# Patient Record
Sex: Female | Born: 1950
Health system: Southern US, Community
[De-identification: ages and names within clinical notes are randomized; demographics above are authoritative.]

## PROBLEM LIST (undated history)

## (undated) DIAGNOSIS — M1990 Unspecified osteoarthritis, unspecified site: Secondary | ICD-10-CM

## (undated) DIAGNOSIS — E039 Hypothyroidism, unspecified: Secondary | ICD-10-CM

## (undated) DIAGNOSIS — J309 Allergic rhinitis, unspecified: Secondary | ICD-10-CM

## (undated) DIAGNOSIS — F449 Dissociative and conversion disorder, unspecified: Secondary | ICD-10-CM

## (undated) DIAGNOSIS — E785 Hyperlipidemia, unspecified: Secondary | ICD-10-CM

## (undated) DIAGNOSIS — I10 Essential (primary) hypertension: Secondary | ICD-10-CM

## (undated) HISTORY — DX: Hyperlipidemia, unspecified: E78.5

## (undated) HISTORY — DX: Hypothyroidism, unspecified: E03.9

## (undated) HISTORY — DX: Dissociative and conversion disorder, unspecified: F44.9

## (undated) HISTORY — DX: Allergic rhinitis, unspecified: J30.9

## (undated) HISTORY — DX: Unspecified osteoarthritis, unspecified site: M19.90

## (undated) HISTORY — DX: Essential (primary) hypertension: I10

---

## 2000-05-10 ENCOUNTER — Other Ambulatory Visit: Admission: RE | Admit: 2000-05-10 | Discharge: 2000-05-10 | Payer: Self-pay | Admitting: *Deleted

## 2002-05-17 ENCOUNTER — Other Ambulatory Visit: Admission: RE | Admit: 2002-05-17 | Discharge: 2002-05-17 | Payer: Self-pay | Admitting: Obstetrics and Gynecology

## 2003-03-16 ENCOUNTER — Encounter: Payer: Self-pay | Admitting: *Deleted

## 2003-03-16 ENCOUNTER — Emergency Department (HOSPITAL_COMMUNITY): Admission: EM | Admit: 2003-03-16 | Discharge: 2003-03-16 | Payer: Self-pay | Admitting: *Deleted

## 2006-01-19 ENCOUNTER — Ambulatory Visit: Payer: Self-pay | Admitting: Family Medicine

## 2006-04-07 ENCOUNTER — Ambulatory Visit: Payer: Self-pay | Admitting: Family Medicine

## 2006-05-09 ENCOUNTER — Ambulatory Visit: Payer: Self-pay | Admitting: Family Medicine

## 2006-06-21 ENCOUNTER — Ambulatory Visit: Payer: Self-pay | Admitting: Family Medicine

## 2006-07-29 ENCOUNTER — Ambulatory Visit: Payer: Self-pay | Admitting: Family Medicine

## 2006-09-13 ENCOUNTER — Ambulatory Visit: Payer: Self-pay | Admitting: Family Medicine

## 2006-09-13 LAB — CONVERTED CEMR LAB: Free T4: 0.5 ng/dL — ABNORMAL LOW (ref 0.6–1.6)

## 2008-01-30 ENCOUNTER — Ambulatory Visit: Payer: Self-pay | Admitting: Family Medicine

## 2008-01-31 ENCOUNTER — Telehealth (INDEPENDENT_AMBULATORY_CARE_PROVIDER_SITE_OTHER): Payer: Self-pay | Admitting: Internal Medicine

## 2008-02-24 HISTORY — PX: WRIST SURGERY: SHX841

## 2008-03-18 ENCOUNTER — Emergency Department (HOSPITAL_COMMUNITY): Admission: EM | Admit: 2008-03-18 | Discharge: 2008-03-18 | Payer: Self-pay | Admitting: Emergency Medicine

## 2008-03-20 ENCOUNTER — Ambulatory Visit (HOSPITAL_BASED_OUTPATIENT_CLINIC_OR_DEPARTMENT_OTHER): Admission: RE | Admit: 2008-03-20 | Discharge: 2008-03-20 | Payer: Self-pay | Admitting: Orthopedic Surgery

## 2008-06-05 ENCOUNTER — Ambulatory Visit: Payer: Self-pay | Admitting: Family Medicine

## 2008-06-07 ENCOUNTER — Telehealth: Payer: Self-pay | Admitting: Family Medicine

## 2008-08-16 ENCOUNTER — Encounter: Payer: Self-pay | Admitting: Family Medicine

## 2010-08-12 ENCOUNTER — Ambulatory Visit
Admission: RE | Admit: 2010-08-12 | Discharge: 2010-08-12 | Payer: Self-pay | Source: Home / Self Care | Attending: Family Medicine | Admitting: Family Medicine

## 2010-08-12 DIAGNOSIS — J309 Allergic rhinitis, unspecified: Secondary | ICD-10-CM | POA: Insufficient documentation

## 2010-08-12 DIAGNOSIS — I10 Essential (primary) hypertension: Secondary | ICD-10-CM | POA: Insufficient documentation

## 2010-08-27 NOTE — Assessment & Plan Note (Signed)
Summary: SORE THROAT/JBB   Vital Signs:  Patient Profile:   60 Years Old Female CC:      Sore Throat Height:     62 inches Weight:      145 pounds BMI:     26.62 O2 Sat:      99 % O2 treatment:    Room Air Temp:     97.8 degrees F oral Pulse rate:   76 / minute Pulse rhythm:   regular Resp:     20 per minute BP sitting:   152 / 90  (left arm)  Pt. in pain?   yes    Location:   neck    Intensity:   5    Type:       aching  Vitals Entered By: Levonne Spiller EMT-P (August 12, 2010 11:32 AM)              Is Patient Diabetic? No  Does patient need assistance? Functional Status Self care Ambulation Normal Comments Pt. quit smoking in 2006      Current Allergies: No known allergies History of Present Illness History from: patient Reason for visit: see chief complaint Chief Complaint: Sore Throat History of Present Illness: This patient is presenting today because she's been experiencing a sore throat for the past several days and it seems to be getting worse. She reports that she has not seen a physician since 2009. She reports that she has been concerned because her daughter was diagnosed with a sinus infection recently and she reports that she's having some postnasal drainage and she's been taking some sinus medications over-the-counter but had to discontinue because she developed palpitations when she took pseudoephedrine. She reports that she's taking no medications at this time. She reports that she has had elevated blood pressures in the past but never diagnosed with hypertension. She has not been seen by a physician in nearly 2 years.  REVIEW OF SYSTEMS Constitutional Symptoms       Complains of fever and chills.     Denies night sweats, weight loss, weight gain, and fatigue.  Eyes       Denies change in vision, eye pain, eye discharge, glasses, contact lenses, and eye surgery. Ear/Nose/Throat/Mouth       Complains of sore throat.      Denies hearing loss/aids,  change in hearing, ear pain, ear discharge, dizziness, frequent runny nose, frequent nose bleeds, sinus problems, hoarseness, and tooth pain or bleeding.  Respiratory       Complains of productive cough.      Denies dry cough, wheezing, shortness of breath, asthma, bronchitis, and emphysema/COPD.      Comments: Clear Sputum Cardiovascular       Denies murmurs, chest pain, and tires easily with exhertion.    Gastrointestinal       Denies stomach pain, nausea/vomiting, diarrhea, constipation, blood in bowel movements, and indigestion. Genitourniary       Denies painful urination, blood or discharge from vagina, kidney stones, and loss of urinary control. Neurological       Denies paralysis, seizures, and fainting/blackouts. Musculoskeletal       Denies muscle pain, joint pain, joint stiffness, decreased range of motion, redness, swelling, muscle weakness, and gout.  Skin       Denies bruising, unusual mles/lumps or sores, and hair/skin or nail changes.  Psych       Denies mood changes, temper/anger issues, anxiety/stress, speech problems, depression, and sleep problems. Other Comments: Pt. states that she  feels that there is a lump in her throat.  Lab Results    Ordered by:  Dr. Maryln Manuel    Date tests performed: 08/12/2010    Performed by:  Levonne Spiller EMT-P    R. Strep:    Neg    Past History:  Past Medical History: osteoarthritis involving the left wrist from a fracture injury in 2009  Past Surgical History: Reviewed history from 06/05/2008 and no changes required. fx R wrist 8/09- surgery  ortho- [facility]  Family History: Mother  Father - Deceased from Lung Cancer age 78 Brother Sister   Social History: Quit Tobacco in April 2007 ETOH 4-5/wk (beers) Denies Recreational Drugs Physical Exam General appearance: well developed, well nourished, no acute distress Head: normocephalic, atraumatic Eyes: conjunctivae and lids normal Pupils: equal, round, reactive to  light Ears: normal, no lesions or deformities Nasal: marked sinus and nasal congestion Oral/Pharynx: tongue normal, posterior pharynx without erythema or exudate Neck: neck supple,  trachea midline, no masses Chest/Lungs: no rales, wheezes, or rhonchi bilateral, breath sounds equal without effort Heart: regular rate and  rhythm, no murmur, normal s1, s2 sounds Abdomen: soft, non-tender without obvious organomegaly Extremities: deformed arthritic left wrist  Neurological: grossly intact and non-focal Skin: no obvious rashes or lesions MSE: oriented to time, place, and person Assessment Problems:   CONTUSION, HEAD (ICD-920) LACERATION, FACE (ICD-873.40) INSECT BITE (ICD-919.4) New Problems: ELEVATED BP READING WITHOUT DX HYPERTENSION (ICD-796.2) ACUTE PHARYNGITIS (ICD-462) ALLERGIC RHINITIS CAUSE UNSPECIFIED (ICD-477.9)   Patient Education: Patient and/or caregiver instructed in the following: rest. The risks, benefits and possible side effects were clearly explained and discussed with the patient.  The patient verbalized clear understanding.  The patient was given instructions to return if symptoms don't improve, worsen or new changes develop.  If it is not during clinic hours and the patient cannot get back to this clinic then the patient was told to seek medical care at an available urgent care or emergency department.  The patient verbalized understanding.   Demonstrates willingness to comply.  Plan New Medications/Changes: LORATADINE 10 MG TABS (LORATADINE) take 1 by mouth daily  #14 x 0, 08/12/2010, Mahkayla Preece MD AMOXICILLIN 875 MG TABS (AMOXICILLIN) take 1 by mouth two times a day until completed  #20 x 0, 08/12/2010, Delontae Lamm MD FLUTICASONE PROPIONATE 50 MCG/ACT SUSP (FLUTICASONE PROPIONATE) 2 sprays per nostril once daily  #1 x 0, 08/12/2010, Kechia Yahnke MD  Planning Comments:   I explained to the patient the importance of following closely on her elevated  blood pressure. I told her to discontinue the use of all decongestants. In addition, I recommended that she come have her blood pressure retested in one week. I asked her to followup with her primary care provider and to start seeing them on a regular basis. The patient verbalized understanding. In addition, I asked the patient to return if symptoms worsen or don't improve or new symptoms develop.  Follow Up: Follow up in 2-3 days if no improvement, Follow up on an as needed basis, Follow up with Primary Physician  The patient and/or caregiver has been counseled thoroughly with regard to medications prescribed including dosage, schedule, interactions, rationale for use, and possible side effects and they verbalize understanding.  Diagnoses and expected course of recovery discussed and will return if not improved as expected or if the condition worsens. Patient and/or caregiver verbalized understanding.  Prescriptions: LORATADINE 10 MG TABS (LORATADINE) take 1 by mouth daily  #14 x 0   Entered  and Authorized by:   Standley Dakins MD   Signed by:   Standley Dakins MD on 08/12/2010   Method used:   [facility]        [facility]  [place] [id number]       [address]       [place], [place]  [address]       Ph: [phone] or [phone]       Fax: [phone]   RxID:   [id number] AMOXICILLIN 875 MG TABS (AMOXICILLIN) take 1 by mouth two times a day until completed  #20 x 0   Entered and Authorized by:   Standley Dakins MD   Signed by:   Standley Dakins MD on 08/12/2010   Method used:   [facility]        [facility]  [place] [id number]       [address]       [place], [place]  [address]       Ph: [phone] or [phone]       Fax: [phone]   RxID:   [id number] FLUTICASONE PROPIONATE 50 MCG/ACT SUSP (FLUTICASONE PROPIONATE) 2 sprays per nostril once daily  #1 x 0   Entered and Authorized by:   Standley Dakins MD   Signed by:   Standley Dakins MD on 08/12/2010   Method used:   [facility]        [facility]  [place] (retail)       [address]       [place], [place]  [address]       Ph: [phone] or [phone]       Fax: [phone]   RxID:   [id number]   Patient Instructions: 1)  Check your Blood Pressure regularly. If it is above: 140/90 you should make an appointment. 2)  Take your antibiotic as prescribed until ALL of it is gone, but stop if you develop a rash or swelling and contact our office as soon as possible. 3)  Acute sinusitis symptoms for less than 10 days are not helped by antibiotics.Use warm moist compresses, and over the counter decongestants ( only as directed). Call if no improvement in 5-7 days, sooner if increasing pain, fever, or new symptoms. 4)  Return or go to the ER if no improvement or symptoms getting worse.   5)  Oral Rehydration Solution: drink 1/2 ounce every 15 minutes. If tolerated afert 1 hour, drink 1 ounce every 15 minutes. As you can tolerate, keep adding 1/2 ounce every 15 minutes, up to a total of 2-4 ounces. Contact the office if unable to tolerate oral solution, if you keep vomiting, or you continue to have signs of dehydration. 6)  The patient was informed that there is no on-call provider or services available at this clinic during off-hours (when the clinic is closed).  If the patient developed a problem or concern that required immediate attention, the patient was advised to go the the nearest available urgent care or emergency department for medical care.  The patient verbalized understanding.    7)  It was clearly explained to the patient that this [facility] is not intended to be a primary care clinic.  The patient is always better served by the continuity of care and the provider/patient relationships developed with their dedicated primary care provider.  The patient was told to be sure to follow up as soon as possible with their primary care provider to  discuss treatments received and to receive  further examination and testing.  The patient verbalized understanding. The will f/u with PCP ASAP.

## 2010-09-23 ENCOUNTER — Encounter: Payer: Self-pay | Admitting: Family Medicine

## 2010-09-23 ENCOUNTER — Other Ambulatory Visit: Payer: Self-pay | Admitting: Family Medicine

## 2010-09-23 ENCOUNTER — Ambulatory Visit (INDEPENDENT_AMBULATORY_CARE_PROVIDER_SITE_OTHER): Payer: BC Managed Care – PPO | Admitting: Family Medicine

## 2010-09-23 DIAGNOSIS — F449 Dissociative and conversion disorder, unspecified: Secondary | ICD-10-CM | POA: Insufficient documentation

## 2010-09-23 DIAGNOSIS — E079 Disorder of thyroid, unspecified: Secondary | ICD-10-CM

## 2010-09-23 DIAGNOSIS — I10 Essential (primary) hypertension: Secondary | ICD-10-CM

## 2010-09-23 LAB — BASIC METABOLIC PANEL
Chloride: 105 mEq/L (ref 96–112)
GFR: 78.87 mL/min (ref >60.00)
Glucose, Bld: 91 mg/dL (ref 70–99)
Potassium: 4.7 mEq/L (ref 3.5–5.1)
Sodium: 140 mEq/L (ref 135–145)

## 2010-09-23 LAB — HEPATIC FUNCTION PANEL
Total Bilirubin: 0.4 mg/dL (ref 0.3–1.2)
Total Protein: 7.6 g/dL (ref 6.0–8.3)

## 2010-09-23 LAB — CBC WITH DIFFERENTIAL/PLATELET
Basophils Relative: 0.6 % (ref 0.0–3.0)
Eosinophils Relative: 2.3 % (ref 0.0–5.0)
Hemoglobin: 13.3 g/dL (ref 12.0–15.0)
Lymphocytes Relative: 19.6 % (ref 12.0–46.0)
Lymphs Abs: 1.8 10*3/uL (ref 0.7–4.0)
Monocytes Absolute: 0.5 10*3/uL (ref 0.1–1.0)
RBC: 3.96 Mil/uL (ref 3.87–5.11)
RDW: 13.3 % (ref 11.5–14.6)

## 2010-09-23 LAB — TSH: TSH: 7.93 u[IU]/mL — ABNORMAL HIGH (ref 0.35–5.50)

## 2010-09-24 ENCOUNTER — Telehealth: Payer: Self-pay | Admitting: Family Medicine

## 2010-09-24 LAB — T3, FREE: T3, Free: 2.4 pg/mL (ref 2.3–4.2)

## 2010-09-25 ENCOUNTER — Telehealth (INDEPENDENT_AMBULATORY_CARE_PROVIDER_SITE_OTHER): Payer: Self-pay | Admitting: *Deleted

## 2010-09-25 ENCOUNTER — Telehealth: Payer: Self-pay | Admitting: Family Medicine

## 2010-09-30 ENCOUNTER — Ambulatory Visit: Payer: BC Managed Care – PPO | Admitting: Family Medicine

## 2010-10-01 NOTE — Assessment & Plan Note (Signed)
Summary: ST/CLE  BCBS   Vital Signs:  Patient profile:   60 year old female Weight:      150.50 pounds Temp:     98.5 degrees F oral Pulse rate:   72 / minute Pulse rhythm:   regular BP sitting:   160 / 100  (left arm) Cuff size:   regular  Vitals Entered By: Selena Batten Dance CMA Duncan Dull) (September 23, 2010 8:28 AM)  Serial Vital Signs/Assessments:  Time      Position  BP       Pulse  Resp  Temp     By                     190/100                        Eustaquio Boyden  MD  CC: Sore throat (intermittant since December)   History of Present Illness: CC: ST since december  2 mo h/o ST (since [place]).  Sunday really bothered her so called for appt, hasn't hurt since then.  Not necessarily pain.  No problems with swallowing.  feels sensation of lump in throat.  + coughing and sneezing.  mild PNDrip.  doesn't notice associated with certain foods.  Does notice as day progresses more sensation of lump in throat.    No fevers/chills, chest congestion, sinus congestion, HA, abd pain, n/v/d, weight changes, changing NS (h/o same since menopause).  No reflux sxs.  no hoarseness.  No sick contacts around her.  No smokers at home (pt quit 10/2005).  No h/o asthma, COPD.  No h/o seasonal allergies.  drinks 3-4 cups coffee/day as well as coke products all day long.  no h/o reflux  BP up, states white coat hypertension.  not on meds for this.  no HA, vision changes, chest pain, tightness, urinary changes, LE swelling.  Not much salt intake.  on recheck elevated again.  + family history of HTN.  drinks 4 beers/night.  Current Medications (verified): 1)  Fosamax 70 Mg Tabs (Alendronate Sodium) .... One By Mouth Q Week  Allergies (verified): No Known Drug Allergies  Past History:  Past Medical History: Last updated: 08/12/2010 osteoarthritis involving the left wrist from a fracture injury in 2009  Social History: Last updated: 08/12/2010 Quit Tobacco in April 2007 ETOH 4-5/wk (beers) Denies  Recreational Drugs  Review of Systems       per HPI  Physical Exam  General:  well developed, well nourished, no acute distress Head:  Normocephalic and atraumatic without obvious abnormalities. No apparent alopecia or balding. Eyes:  No corneal or conjunctival inflammation noted. EOMI. Perrla.  Ears:  TMs clear bilaterally, scant cerumen Nose:  nares clear bilaterally Mouth:  MMM< no pharyngeal erythema/exudates Neck:  No deformities, masses, or tenderness noted.  no LAD Lungs:  Normal respiratory effort, chest expands symmetrically. Lungs are clear to auscultation, no crackles or wheezes. Heart:  Normal rate and regular rhythm. S1 and S2 normal without gallop, murmur, click, rub or other extra sounds. Pulses:  2+ rad pulses bilaterally, brisk cap refill Extremities:  no pedal edema   Impression & Recommendations:  Problem # 1:  HYPERTENSION, ESSENTIAL (ICD-401.9) BP very elevated today - has been in past.  pt hesitant to start med.  on recheck, even more elevated to 190/100.  dsicussed this, recommended start med, blood work checked.  Start HCTZ 25mg  daily.  Return 1 mo for f/u.  hypertensive urgency today.  Orders: Venipuncture (40981) TLB-BMP (Basic Metabolic Panel-BMET) (80048-METABOL) TLB-CBC Platelet - w/Differential (85025-CBCD) TLB-Hepatic/Liver Function Pnl (80076-HEPATIC) TLB-TSH (Thyroid Stimulating Hormone) (84443-TSH)  Her updated medication list for this problem includes:    Hydrochlorothiazide 25 Mg Tabs (Hydrochlorothiazide) .Marland Kitchen... Take one daily for blood pressure  BP today: 160/100 Prior BP: 152/90 (08/12/2010)  Problem # 2:  GLOBUS HYSTERICUS (ICD-300.11) ? silent reflux.  trial of PPI x 2-3 wks, return if not better. quit smoking 2007 but h/o smoking in past.  if not better, consider referral to ENT.  Complete Medication List: 1)  Fosamax 70 Mg Tabs (Alendronate sodium) .... One by mouth q week 2)  Omeprazole 40 Mg Cpdr (Omeprazole) .... One daily for  3 weeks 3)  Hydrochlorothiazide 25 Mg Tabs (Hydrochlorothiazide) .... Take one daily for blood pressure  Patient Instructions: 1)  Sounds like some silent reflux  2)  Treat with omeprazole daily for 2-3 wks (30 min before meal) 3)  cut back on caffeine as you can 4)  watch spicy foods or acidic foods like citrus and tomatoesdrink plenty of water. 5)  Return if not improving, or if any fevers/chills, weight changes, or hoarseness. 6)  Start hydrochlorothiazide once daily for blood pressure.  return next wednesday for follow up with Dr Milinda Antis. Prescriptions: HYDROCHLOROTHIAZIDE 25 MG TABS (HYDROCHLOROTHIAZIDE) take one daily for blood pressure  #30 x 3   Entered and Authorized by:   Eustaquio Boyden  MD   Signed by:   Eustaquio Boyden  MD on 09/23/2010   Method used:   [facility]        [facility]  [place] [facility]       [address]       [place], [place]  [address]       Ph: [phone] or [phone]       Fax: [phone]   RxID:   [id number] OMEPRAZOLE 40 MG CPDR (OMEPRAZOLE) one daily for 3 weeks  #30 x 0   Entered and Authorized by:   Eustaquio Boyden  MD   Signed by:   Eustaquio Boyden  MD on 09/23/2010   Method used:   [facility]        [facility]  [place] [id number]       [address]       [place], [place]  [address]       Ph: [phone] or [phone]       Fax: [phone]   RxID:   [id number]    Orders Added: 1)  Venipuncture [83151] 2)  TLB-BMP (Basic Metabolic Panel-BMET) [80048-METABOL] 3)  TLB-CBC Platelet - w/Differential [85025-CBCD] 4)  TLB-Hepatic/Liver Function Pnl [80076-HEPATIC] 5)  TLB-TSH (Thyroid Stimulating Hormone) [84443-TSH] 6)  Est. Patient Level IV [76160]    Current Allergies (reviewed today): No known allergies

## 2010-10-01 NOTE — Progress Notes (Signed)
----   Converted from flag ---- ---- 09/23/2010 6:24 PM, Eustaquio Boyden  MD wrote: can we add free T3 and free T4 to blood in lab?  thanks. 246.9 ------------------------------

## 2010-10-06 NOTE — Progress Notes (Signed)
Summary: regarding hctz  Phone Note Call from Patient   Caller: Patient Complaint: Earache/Ear Infection Summary of Call: Pt was seen yesterday and given hctz.  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  She cancelled her appt for next wednesday, will call back to reschedule after she starts the medicine.  Initial call taken by: Lowella Petties CMA, AAMA,  September 24, 2010 12:49 PM  Follow-up for Phone Call        noted.  want her to f/u in next few weeks to monitor BP.  have her keep track of bp at home [facility], if staying high, return sooner.  if any chest pain/HA, needs to go to ER. Follow-up by: Eustaquio Boyden  MD,  September 24, 2010 12:53 PM  Additional Follow-up for Phone Call Additional follow up Details #1::        Message left for patient to return my call. Kim Dance CMA Duncan Dull)  September 24, 2010 2:56 PM   Message left for patient to return my call. Kim Dance CMA Duncan Dull)  September 25, 2010 11:59 AM   Message left for patient to return my call. Kim Dance CMA Duncan Dull)  September 28, 2010 12:29 PM     Additional Follow-up for Phone Call Additional follow up Details #2::    Spoke with pt and gave instructions.             Lowella Petties CMA, AAMA  September 28, 2010 2:32 PM

## 2010-10-06 NOTE — Progress Notes (Signed)
Summary: prior Berkley Harvey is needed for omeprazole  Phone Note From Pharmacy   [facility]  [place] [facility] Summary of Call: Prior Berkley Harvey is needed for omeprazole, prior auth form is in your box, Dr. Royden Purl pt, but you prescribed medicine.  Thanks. Initial call taken by: Lowella Petties CMA, AAMA,  September 25, 2010 12:45 PM  Follow-up for Phone Call        changed med per insurance.  please notify patient may be OTC. Follow-up by: Eustaquio Boyden  MD,  September 25, 2010 1:48 PM  Additional Follow-up for Phone Call Additional follow up Details #1::        Message left for patient to return my call. Kim Dance CMA Duncan Dull)  September 28, 2010 12:29 PM   Advised pt. Additional Follow-up by: Lowella Petties CMA, AAMA,  September 28, 2010 2:33 PM    New/Updated Medications: OMEPRAZOLE MAGNESIUM 20.6 (20 BASE) MG CPDR (OMEPRAZOLE MAGNESIUM) take one daily 30 min before meal OMEPRAZOLE 20 MG CPDR (OMEPRAZOLE) one daily 30 min before meals for 3 wks Prescriptions: OMEPRAZOLE 20 MG CPDR (OMEPRAZOLE) one daily 30 min before meals for 3 wks  #30 x 0   Entered and Authorized by:   Eustaquio Boyden  MD   Signed by:   Eustaquio Boyden  MD on 09/25/2010   Method used:   [facility]        [facility]  [place] [facility]       [address]       [place], [place]  [address]       Ph: [phone] or [phone]       Fax: [phone]   RxID:   [id number] OMEPRAZOLE MAGNESIUM 20.6 (20 BASE) MG CPDR (OMEPRAZOLE MAGNESIUM) take one daily 30 min before meal  #30 x 0   Entered and Authorized by:   Eustaquio Boyden  MD   Signed by:   Eustaquio Boyden  MD on 09/25/2010   Method used:   [facility]        [facility]  [place] (retail)       [address]       [place], [place]  [address]       Ph: [phone] or [phone]       Fax: [phone]   RxID:   [id number]

## 2010-10-07 ENCOUNTER — Encounter: Payer: Self-pay | Admitting: Family Medicine

## 2010-10-07 ENCOUNTER — Ambulatory Visit (INDEPENDENT_AMBULATORY_CARE_PROVIDER_SITE_OTHER): Payer: BC Managed Care – PPO | Admitting: Family Medicine

## 2010-10-07 DIAGNOSIS — F449 Dissociative and conversion disorder, unspecified: Secondary | ICD-10-CM

## 2010-10-07 DIAGNOSIS — I10 Essential (primary) hypertension: Secondary | ICD-10-CM

## 2010-10-07 DIAGNOSIS — E039 Hypothyroidism, unspecified: Secondary | ICD-10-CM | POA: Insufficient documentation

## 2010-10-13 NOTE — Assessment & Plan Note (Signed)
Summary: FOLLOW UP   Vital Signs:  Patient profile:   60 year old female Weight:      147.50 pounds Temp:     98.1 degrees F oral Pulse rate:   88 / minute Pulse rhythm:   regular BP sitting:   156 / 82  (left arm) Cuff size:   regular  Vitals Entered By: Selena Batten Dance CMA Duncan Dull) (October 07, 2010 9:23 AM)  Serial Vital Signs/Assessments:  Time      Position  BP       Pulse  Resp  Temp     By           R Arm     170/90                         Eustaquio Boyden  MD           L Arm     170/90                         Eustaquio Boyden  MD  CC: BP follow up   History of Present Illness: CC: BP f/u  seen 2-3 wks ago with elevated BP.  Started on HCTZ.  drinks water with this.  Taking HCTZ between meal.  no HA, vision changes, chest pain, tightness, SOB, urinary changes, LE swelling.  TSH elevated last visit, had been on meds in past, told had hypothyroid but had seemed to resolve, not in chart.  "lump in throat" - Has not taken reflux medicine.  takes rolaids.  not bothering her as much.  has only felt lump sensation once in last 2-3 wks for a few hours (after swallowed HCTZ pill once)  walking 1 mile a day.  down 3 lbs!  congratulated.  No coke since march 1st, no beer since march 2nd.  drinking more water.  taking fosamax weekly.  father with h/o esophageal cancer and lung cancer.  Current Medications (verified): 1)  Fosamax 70 Mg Tabs (Alendronate Sodium) .... One By Mouth Q Week 2)  Omeprazole 20 Mg Cpdr (Omeprazole) .... One Daily 30 Min Before Meals For 3 Wks 3)  Hydrochlorothiazide 25 Mg Tabs (Hydrochlorothiazide) .... Take One Daily For Blood Pressure  Allergies (verified): No Known Drug Allergies  Past History:  Social History: Last updated: 08/12/2010 Quit Tobacco in April 2007 ETOH 4-5/wk (beers) Denies Recreational Drugs  Past Medical History: osteoarthritis involving the left wrist from a fracture injury in 2009 HTN Hypothyroid  Family History: Mother  HTN Father - Deceased from Lung Cancer age 67, also with esophagus cancer. Brother HTN Sister HTN, hypothyroid  Review of Systems       per HPI  Physical Exam  General:  well developed, well nourished, no acute distress Head:  Normocephalic and atraumatic without obvious abnormalities. No apparent alopecia or balding. Mouth:  MMM< no pharyngeal erythema/exudates Neck:  No deformities, masses, or tenderness noted.  no LAD, no TM Lungs:  Normal respiratory effort, chest expands symmetrically. Lungs are clear to auscultation, no crackles or wheezes. Heart:  Normal rate and regular rhythm. S1 and S2 normal without gallop, murmur, click, rub or other extra sounds. Pulses:  2+ rad pulses bilaterally, brisk cap refill Extremities:  no pedal edema   Impression & Recommendations:  Problem # 1:  HYPERTENSION, ESSENTIAL (ICD-401.9) Assessment Improved improved some since last visit, still not at goal.rechcek 170/90.  TSH high, hypothryoid, treat and readdress BP.  (  could be accounting for HTN 2/2 elevated PVR).  Her updated medication list for this problem includes:    Hydrochlorothiazide 25 Mg Tabs (Hydrochlorothiazide) .Marland Kitchen... Take one daily for blood pressure  BP today: 156/82 Prior BP: 160/100 (09/23/2010)  Labs Reviewed: K+: 4.7 (09/23/2010) Creat: : 0.8 (09/23/2010)     Problem # 2:  HYPOTHYROIDISM (ICD-244.9) Assessment: New start synthroid.  ? accounting for HTN.  recheck in 6 wks, along with FLP.  Her updated medication list for this problem includes:    Synthroid 75 Mcg Tabs (Levothyroxine sodium) ..... One daily for thyroid  Labs Reviewed: TSH: 7.93 (09/23/2010)     Problem # 3:  GLOBUS HYSTERICUS (ICD-300.11) Assessment: Improved improving, hasnt used PPI.  only rolaid.  only has had episode x 1 in last 2-3 wks after swallowed HCTZ amd resolved after 1-2 hours.  advised monitor, if continues to bother would likely refer to GI for EGD (fmhx esoph cancer and pt on  fosamax).    no unexplained weight loss, no fevers/chills, no dysphagia.  Complete Medication List: 1)  Fosamax 70 Mg Tabs (Alendronate sodium) .... One by mouth q week 2)  Omeprazole 20 Mg Cpdr (Omeprazole) .... One daily 30 min before meals for 3 wks 3)  Hydrochlorothiazide 25 Mg Tabs (Hydrochlorothiazide) .... Take one daily for blood pressure 4)  Synthroid 75 Mcg Tabs (Levothyroxine sodium) .... One daily for thyroid  Patient Instructions: 1)  return in 2 wks for follow up with myself or Dr. Milinda Antis. 2)  we will want to check thyroid function again in 6 weeks. 3)  We may need to add another medicine at follow up. 4)  Start thyroid medicine daily. 5)  Good job with the lifestyle changes up to now! 6)  Call us with questions. Prescriptions: SYNTHROID 75 MCG TABS (LEVOTHYROXINE SODIUM) one daily for thyroid  #30 x 1   Entered and Authorized by:   Eustaquio Boyden  MD   Signed by:   Eustaquio Boyden  MD on 10/07/2010   Method used:   Electronically to        [facility]  [address] (retail)       [address]       [place], [place]  [address]       Ph: [phone] or [phone]       Fax: [phone]   RxID:   [id number]    Orders Added: 1)  Est. Patient Level IV [27253]    Current Allergies (reviewed today): No known allergies

## 2010-10-16 ENCOUNTER — Encounter: Payer: Self-pay | Admitting: Family Medicine

## 2010-10-21 ENCOUNTER — Ambulatory Visit (INDEPENDENT_AMBULATORY_CARE_PROVIDER_SITE_OTHER): Payer: BC Managed Care – PPO | Admitting: Family Medicine

## 2010-10-21 ENCOUNTER — Encounter: Payer: Self-pay | Admitting: Family Medicine

## 2010-10-21 VITALS — BP 156/90 | HR 72 | Temp 98.4°F | Wt 146.1 lb

## 2010-10-21 DIAGNOSIS — I10 Essential (primary) hypertension: Secondary | ICD-10-CM

## 2010-10-21 DIAGNOSIS — E039 Hypothyroidism, unspecified: Secondary | ICD-10-CM

## 2010-10-21 NOTE — Patient Instructions (Signed)
Return in 4 weeks for blood check (thyroid and cholesterol levels). Return after for appointment. Continue meds as up to now. Blood pressure is getting better!  Continue watching diet, exercise. Let us know if throat sensation becoming a bother to send you to stomach doctor to take a look.

## 2010-10-21 NOTE — Assessment & Plan Note (Signed)
Improved after started levothyroxine.  ? HTN 2/2 hypothyroidism from elevated PVR. No changes currently. ROS neg. Return 1 mo for f/u.

## 2010-10-21 NOTE — Assessment & Plan Note (Signed)
States feeling better on levothyroxine . Return 1 mo for recheck TSH and f/u HTN.

## 2010-10-21 NOTE — Progress Notes (Signed)
  Subjective:    Patient ID: Jocelyn Payne, female    DOB: June 29, 1951, 60 y.o.   MRN: [id number]  HPI CC: f/u HTN   Seen 2 wks ago with continued elevated bp, TSH checked and returned high.  Started on levothyroxine daily.  Actually feeling better, more energy.  BP slowly coming down.    No recent skin changes.  No diarrhea or constipation, heat/cold intolerance.  Does notice hair falling out maybe a bit more.  Due for recheck TSH in 4 wks.  No HA, vision changes, CP/tightness, SOB, leg swelling.   Globus sensation comes and goes.  Usually worse with taking pills.  Father with esophagus cancer (smoker).  Pt quit smoking 2007.  Medications and allergies reviewed and updated as above. PMhx reviewed.  Review of Systems Per HPI    Objective:   Physical Exam  Vitals reviewed. Constitutional: She appears well-developed and well-nourished. No distress.  HENT:  Head: Normocephalic and atraumatic.  Mouth/Throat: Oropharynx is clear and moist. No oropharyngeal exudate.  Eyes: Conjunctivae and EOM are normal. Pupils are equal, round, and reactive to light.  Neck: Normal range of motion. Neck supple. No thyromegaly present.  Cardiovascular: Normal rate, regular rhythm, normal heart sounds and intact distal pulses.   No murmur heard. Pulmonary/Chest: Effort normal and breath sounds normal. No respiratory distress. She has no wheezes. She has no rales.  Lymphadenopathy:    She has no cervical adenopathy.  Skin: Skin is warm and dry. No rash noted.       Assessment & Plan:

## 2010-10-22 ENCOUNTER — Ambulatory Visit: Payer: BC Managed Care – PPO | Admitting: Family Medicine

## 2010-11-11 ENCOUNTER — Other Ambulatory Visit (INDEPENDENT_AMBULATORY_CARE_PROVIDER_SITE_OTHER): Payer: BC Managed Care – PPO | Admitting: Family Medicine

## 2010-11-11 DIAGNOSIS — I10 Essential (primary) hypertension: Secondary | ICD-10-CM

## 2010-11-11 DIAGNOSIS — E785 Hyperlipidemia, unspecified: Secondary | ICD-10-CM

## 2010-11-11 DIAGNOSIS — E039 Hypothyroidism, unspecified: Secondary | ICD-10-CM

## 2010-11-11 LAB — LDL CHOLESTEROL, DIRECT: Direct LDL: 151.4 mg/dL

## 2010-11-11 LAB — TSH: TSH: 2.57 u[IU]/mL (ref 0.35–5.50)

## 2010-11-11 LAB — LIPID PANEL
Cholesterol: 234 mg/dL — ABNORMAL HIGH (ref 0–200)
Total CHOL/HDL Ratio: 4
Triglycerides: 92 mg/dL (ref 0.0–149.0)
VLDL: 18.4 mg/dL (ref 0.0–40.0)

## 2010-11-12 NOTE — Progress Notes (Signed)
Patient notified. Will keep follow up.

## 2010-11-17 ENCOUNTER — Other Ambulatory Visit: Payer: Self-pay | Admitting: *Deleted

## 2010-11-17 MED ORDER — HYDROCHLOROTHIAZIDE 25 MG PO TABS: 25.0000 mg | ORAL_TABLET | Freq: Every day | ORAL | Status: DC

## 2010-11-17 NOTE — Telephone Encounter (Signed)
Refilled HCTZ for 90 supply

## 2010-11-18 ENCOUNTER — Other Ambulatory Visit: Payer: Self-pay | Admitting: *Deleted

## 2010-11-25 ENCOUNTER — Ambulatory Visit: Payer: BC Managed Care – PPO | Admitting: Family Medicine

## 2010-11-26 ENCOUNTER — Other Ambulatory Visit: Payer: Self-pay | Admitting: Family Medicine

## 2010-11-27 MED ORDER — LEVOTHYROXINE SODIUM 75 MCG PO TABS: 75.0000 ug | ORAL_TABLET | Freq: Every day | ORAL | Status: DC

## 2010-11-27 MED ORDER — HYDROCHLOROTHIAZIDE 25 MG PO TABS: 25.0000 mg | ORAL_TABLET | Freq: Every day | ORAL | Status: DC

## 2010-11-27 NOTE — Telephone Encounter (Signed)
Done

## 2010-11-27 NOTE — Telephone Encounter (Signed)
Quantity changed on levothyroxine and HCTZ to 90 this time only, per pharmacy's request.

## 2010-12-02 ENCOUNTER — Ambulatory Visit: Payer: BC Managed Care – PPO | Admitting: Family Medicine

## 2010-12-02 ENCOUNTER — Other Ambulatory Visit: Payer: Self-pay | Admitting: *Deleted

## 2010-12-02 MED ORDER — LEVOTHYROXINE SODIUM 75 MCG PO TABS: 75.0000 ug | ORAL_TABLET | Freq: Every day | ORAL | Status: DC

## 2010-12-03 ENCOUNTER — Ambulatory Visit: Payer: BC Managed Care – PPO | Admitting: Family Medicine

## 2010-12-08 NOTE — Op Note (Signed)
Jocelyn Payne, Jocelyn Payne                ACCOUNT NO.:  [id number]   MEDICAL RECORD NO.:  [id number]          PATIENT TYPE:  AMB   LOCATION:  [facility]                          FACILITY:  [facility]   PHYSICIAN:  Feliberto Gottron. Turner Daniels, M.D.   DATE OF BIRTH:  [birth year]   DATE OF PROCEDURE:  03/20/2008  DATE OF DISCHARGE:                               OPERATIVE REPORT   PREOPERATIVE DIAGNOSIS:  Left distal radius fracture, intra-articular  and comminuted.   POSTOPERATIVE DIAGNOSIS:  Left distal radius fracture, intra-articular  and comminuted.   PROCEDURE:  Open reduction and internal fixation, left wrist distal  radius fracture, using a hand innovation standard left DVR plate with 4  proximal bicortical screws and 5 distal locking screws.   SURGEON:  Feliberto Gottron. Turner Daniels, MD   FIRST ASSISTANT:  Shirl Harris, PA-C   ANESTHETIC:  General LMA and interclavicular block.   ESTIMATED BLOOD LOSS:  100 mL.   FLUID REPLACEMENT:  About 1300 mL of crystalloid.   DRAINS PLACED:  None.   TOURNIQUET TIME:  Approximately 39 minutes.   DRAINS PLACED:  None.   INDICATIONS FOR PROCEDURE:  The patient is a 60 year old woman who  slipped and fell a few days ago and sustained a closed comminuted intra-  articular left distal radius fracture with about a centimeter of  shortening and dorsal angulation of about 25 degrees.  She is quite  active.  She is 60 years old and desires elective open reduction and  internal fixation with a DVR plate to decrease pain, increase function,  and get her back out to length.  Risks and benefits of surgery discussed  and questions answered.   DESCRIPTION OF PROCEDURE:  The patient was taken to the block area at  [facility].  Appropriate anesthetic monitors were attached  and interclavicular block was induced to the left upper extremity.  She  was then taken to operating room 6, received a gram of Ancef  preoperatively.  The appropriate anesthetic monitors were  reattached and  general LMA anesthesia induced with the patient in supine position.  Tourniquet applied high at the left upper extremity, which was then  prepped and draped in usual sterile fashion.  The left upper extremity  was then prepped and draped in usual sterile fashion from the fingertips  to area of tourniquet.  A standard time-out procedure was performed and  the hair was braided.  Limb was wrapped with an Esmarch bandage,  tourniquet inflated to 300 mmHg and we began the actual procedure itself  by making a volar midline incision starting at the insertion of the FCR  tendon going proximally for about 10 cm.  We cut down through the skin  and subcutaneous tissue.  Small bleeders identified and cauterized.  The  sheath over the FCR was incised volarly.  The FCR reflected radially and  then went through the dorsal aspect of the FCR sheath dropping down on  the pronator quadratus, which was then released off its insertion on the  radial aspect of the radius.  This exposed the fracture site, which was  relatively easily reduced with traction and reverse angulation.  We then  provisionally applied a four-hole standard left DVR plate to the distal  radius using the gliding hole with a 3/5 bicortical screw and placed a  temporary distal K-wire for fixation.  C-arm images were taken showing  good reduction.  At this point with the wrist held reduced, we went  ahead and applied 3 of the distal locking screws anywhere from 20-22 mm  in length, rechecked with the C-arm and found the reduction to be  excellent.  Two more proximal locking screws were then applied and final  C-arm images taken after placing the rest of the bicortical proximal  screws.  The wrist was taken to range of motion confirming no intra-  articular placement of the screws, and again C-arm images were taken  also confirming no intra-articular placement of the screws.  At this  point, the tourniquet was let down and the  wound was irrigated out with  normal saline solution.  Small bleeders identified and cauterized.  The  subcutaneous tissue was closed with running 3-0 Vicryl suture and the  skin with running interlocking 4-0 nylon suture.  A dressing of  Xeroform, 4 x 4 dressing, sponges, Webril, Ace wrap, and a Velfoam wrist  forearm splint was then applied.  The patient was awakened and taken to  the recovery room without difficulty.       Feliberto Gottron. Turner Daniels, M.D.  Electronically Signed     FJR/MEDQ  D:  03/20/2008  T:  03/21/2008  Job:  [id number]

## 2010-12-16 ENCOUNTER — Ambulatory Visit (INDEPENDENT_AMBULATORY_CARE_PROVIDER_SITE_OTHER): Payer: BC Managed Care – PPO | Admitting: Family Medicine

## 2010-12-16 ENCOUNTER — Encounter: Payer: Self-pay | Admitting: Family Medicine

## 2010-12-16 VITALS — BP 136/84 | HR 64 | Temp 98.4°F | Wt 147.0 lb

## 2010-12-16 DIAGNOSIS — I10 Essential (primary) hypertension: Secondary | ICD-10-CM

## 2010-12-16 DIAGNOSIS — Z1231 Encounter for screening mammogram for malignant neoplasm of breast: Secondary | ICD-10-CM

## 2010-12-16 DIAGNOSIS — E785 Hyperlipidemia, unspecified: Secondary | ICD-10-CM

## 2010-12-16 DIAGNOSIS — E039 Hypothyroidism, unspecified: Secondary | ICD-10-CM

## 2010-12-16 NOTE — Assessment & Plan Note (Signed)
Stable on current dose. Continue. Return 6 mo for f/u.

## 2010-12-16 NOTE — Assessment & Plan Note (Signed)
Stable control on HCTZ 25mg  daily.  Continue.  Return in 6 mo for blood work, recheck.

## 2010-12-16 NOTE — Patient Instructions (Signed)
Good to see you today. I think things are looking good. Work on low cholesterol diet, continue meds as up to now. Call us with questions. Return in 6 months for recheck with myself or Dr. Milinda Antis.

## 2010-12-16 NOTE — Progress Notes (Signed)
  Subjective:    Patient ID: Jocelyn Payne, female    DOB: 1951-05-18, 60 y.o.   MRN: [id number]  HPI CC: f/u HTN, hypothyroid  Found to be hypertensive and hypothyroid.  On synthroid and HCTZ.  No HA, vision changes, CP/tightness, SOB, leg swelling.  No skin changes, hair changes, diarrhea, constipation.  Bit by 3 different deer ticks, leaves big spots.  No RMSF or lyme sxs, knows what to watch out for.  No family history of heart disease/attacks.    Quit smoking 2007.  Struggles daily, one motivation is cost of pack of cigarettes.  Requests to be set up for screening mammo, due for CPE.  Review of Systems Per HPI    Objective:   Physical Exam  Nursing note and vitals reviewed. Constitutional: She appears well-developed and well-nourished. No distress.  HENT:  Head: Normocephalic and atraumatic.  Mouth/Throat: Oropharynx is clear and moist. No oropharyngeal exudate.  Eyes: Conjunctivae and EOM are normal. Pupils are equal, round, and reactive to light. No scleral icterus.  Neck: Normal range of motion. Neck supple. Carotid bruit is not present. No thyromegaly present.  Cardiovascular: Normal rate, regular rhythm, normal heart sounds and intact distal pulses.   No murmur heard. Pulmonary/Chest: Effort normal and breath sounds normal. No respiratory distress. She has no wheezes. She has no rales.  Abdominal: There is no tenderness.       No abd/renal bruit  Lymphadenopathy:    She has no cervical adenopathy.  Skin: Skin is warm and dry. No rash noted.          Assessment & Plan:

## 2010-12-16 NOTE — Assessment & Plan Note (Signed)
Discussed LDL 156. Goal for her <130.   Discussed TLC, provided with handout on healthy eating.  return in 6 mo for f/u.

## 2011-02-24 ENCOUNTER — Ambulatory Visit (INDEPENDENT_AMBULATORY_CARE_PROVIDER_SITE_OTHER): Payer: BC Managed Care – PPO | Admitting: Family Medicine

## 2011-02-24 ENCOUNTER — Encounter: Payer: Self-pay | Admitting: Family Medicine

## 2011-02-24 ENCOUNTER — Ambulatory Visit: Payer: Self-pay | Admitting: Family Medicine

## 2011-02-24 VITALS — BP 138/76 | HR 80 | Temp 98.3°F | Wt 143.8 lb

## 2011-02-24 DIAGNOSIS — H6120 Impacted cerumen, unspecified ear: Secondary | ICD-10-CM

## 2011-02-24 DIAGNOSIS — H6121 Impacted cerumen, right ear: Secondary | ICD-10-CM

## 2011-02-24 DIAGNOSIS — T148 Other injury of unspecified body region: Secondary | ICD-10-CM

## 2011-02-24 DIAGNOSIS — W57XXXA Bitten or stung by nonvenomous insect and other nonvenomous arthropods, initial encounter: Secondary | ICD-10-CM

## 2011-02-24 DIAGNOSIS — T148XXA Other injury of unspecified body region, initial encounter: Secondary | ICD-10-CM

## 2011-02-24 NOTE — Progress Notes (Signed)
  Subjective:    Patient ID: Jocelyn Payne, female    DOB: 1950/09/23, 60 y.o.   MRN: [id number]  HPI CC: R ear muffled  2wk h/o R ear feeling muffled.  Also head did not feel right.  Doesn't use qtips to clean inside of ear.  No discharge/draining, no pain, fevers, chills, no recent swimming.  No sick contacts at home.  H/o ear infections in past (years ago).  No smokers at home.  bp well controlled today.    Has had mult tick bites in last month.  No new fevers or myalgias or arthralgias or rashes, or nausea/abd pain, but does endorse headaches occasionally (not worse).  Review of Systems Per HPI    Objective:   Physical Exam  Nursing note and vitals reviewed. Constitutional: She appears well-developed and well-nourished. No distress.  HENT:  Head: Normocephalic and atraumatic.  Right Ear: Hearing and external ear normal.  Left Ear: Hearing, external ear and ear canal normal.  Nose: Nose normal.  Mouth/Throat: Oropharynx is clear and moist. No oropharyngeal exudate.       R cerumen impaction, cleared and TM with good light reflex  Eyes: Conjunctivae and EOM are normal. Pupils are equal, round, and reactive to light. No scleral icterus.  Neck: Normal range of motion. Neck supple.  Musculoskeletal: Normal range of motion. She exhibits no edema.  Lymphadenopathy:    She has no cervical adenopathy.  Skin: Skin is warm and dry. No rash noted.  Psychiatric: She has a normal mood and affect.          Assessment & Plan:

## 2011-02-24 NOTE — Assessment & Plan Note (Signed)
Discussed red flags to seek medical care.

## 2011-02-24 NOTE — Patient Instructions (Signed)
For ear - wax buildup cleaned today. For tick bites - watch for developing fevers, new rash, muscle or joint pains or headache or abdominal pain 1-2 wks after tick bite.  If any of this happens, let us know. Try to get tick off within 36 hours of bite when at all possible. You are due for physical, schedule with Dr. Milinda Antis or myself at your convenience.

## 2011-02-24 NOTE — Assessment & Plan Note (Addendum)
Disimpacted with irrigation. Patient tolerated well. Update if sxs return Discussed use of dilute peroxide to keep clean once weekly or every few weeks.

## 2011-03-02 ENCOUNTER — Encounter: Payer: Self-pay | Admitting: Family Medicine

## 2011-03-03 ENCOUNTER — Telehealth: Payer: Self-pay

## 2011-03-03 ENCOUNTER — Encounter: Payer: Self-pay | Admitting: *Deleted

## 2011-03-03 NOTE — Telephone Encounter (Signed)
Lugene in Triage transferred call to me.Pt was concerned she had not heard from mammogram done on 02/24/11. I looked in pt's chart and let her know a letter was mailed by Selena Batten today that the recent breast imaging examination showed no suspicious findings for breast cancer. Pt said she was relieved and would wait for the official letter.

## 2011-04-02 ENCOUNTER — Telehealth: Payer: Self-pay | Admitting: *Deleted

## 2011-04-02 NOTE — Telephone Encounter (Signed)
Pt states she has a bad cold and asks what she can take otc with her BP problems. Advised her to check with her pharmacist, but, anything that doesn't have a decongestant should be ok, such as zyrtec.

## 2011-04-04 NOTE — Telephone Encounter (Signed)
Agree with above - any med without decongestant

## 2011-05-07 ENCOUNTER — Telehealth: Payer: Self-pay | Admitting: Family Medicine

## 2011-05-07 NOTE — Telephone Encounter (Signed)
Pt called and said she was bit by a donkey yesterday.  She said that they immunize their animals and the donkey was up to date on immunizations.  Patient questioned if she needed to be seen or any shots.  She can be called back at [phone].

## 2011-05-07 NOTE — Telephone Encounter (Signed)
Spoke with patient. Advised she needed to be evaluated for possible infection and that she may need abx and tetanus vaccine. Advised her to go to [facility] or Saturday clinic tomorrow for possible prophylactic abx and Tdap since we had no availability today. She said she would go to [facility] today.

## 2011-05-08 NOTE — Telephone Encounter (Signed)
Agree with that recommendation.

## 2011-05-10 ENCOUNTER — Ambulatory Visit (INDEPENDENT_AMBULATORY_CARE_PROVIDER_SITE_OTHER): Payer: BC Managed Care – PPO | Admitting: Family Medicine

## 2011-05-10 ENCOUNTER — Encounter: Payer: Self-pay | Admitting: Family Medicine

## 2011-05-10 DIAGNOSIS — I10 Essential (primary) hypertension: Secondary | ICD-10-CM

## 2011-05-10 DIAGNOSIS — S61259A Open bite of unspecified finger without damage to nail, initial encounter: Secondary | ICD-10-CM

## 2011-05-10 DIAGNOSIS — S61209A Unspecified open wound of unspecified finger without damage to nail, initial encounter: Secondary | ICD-10-CM

## 2011-05-10 DIAGNOSIS — Z23 Encounter for immunization: Secondary | ICD-10-CM

## 2011-05-10 MED ORDER — DOXYCYCLINE HYCLATE 100 MG PO CAPS: 100.0000 mg | ORAL_CAPSULE | Freq: Two times a day (BID) | ORAL | Status: AC

## 2011-05-10 NOTE — Progress Notes (Signed)
Addended by: Josph Macho A on: 05/10/2011 09:59 AM   Modules accepted: Orders

## 2011-05-10 NOTE — Assessment & Plan Note (Addendum)
Given on finger, take abx orally.  Treat finger with abx ointment as well. Update [place] if red flags (erythema, worse pain, pus, or other concerns). Updated Tdap today.

## 2011-05-10 NOTE — Patient Instructions (Addendum)
Tdap today. Keep area clean and dry. Antibiotic ointment to skin daily (2-3 times daily). Take antibiotic as prescribed. No more peroxide If swelling, more pain or redness, or draining pus, please let us know. Let's keep an eye on blood pressure - check it at [facility].  If staying >!40/90, please call us and we will start new medicine. Good to see you today, call us with questions.

## 2011-05-10 NOTE — Assessment & Plan Note (Signed)
No sxs.  Attributing to increased stress with grandson this morning. However did ask pt to keep track of BP at local store, call us if consistently >140/90 to add new med. Has CPE scheduled for next month.

## 2011-05-10 NOTE — Progress Notes (Signed)
  Subjective:    Patient ID: Jocelyn Payne, female    DOB: May 19, 1951, 60 y.o.   MRN: [id number]  HPI CC: donkey bite.  05/06/2011 - donkey bit her R ring finger.  Didn't break skin, got her nail, however did split skin ventral finger.  Has kept area clean, washed hands.  Pouring peroxide on finger daily.  bp elevated today, however did have to bring grandson into appt which she is not used to, increased stress.  No HA, vision changes, CP/tightness, SOB, leg swelling.   Last tetanus 2004.  Review of Systems Per HPI    Objective:   Physical Exam  Nursing note and vitals reviewed. Constitutional: She appears well-developed and well-nourished. No distress.  Skin: Skin is warm and dry. No rash noted.       R ring finger ventral pulp with longitudinal <1cm lac, no surrounding erythema or draining. Neurovascularly intact, sensation intact  Psychiatric: She has a normal mood and affect.      Assessment & Plan:

## 2011-05-11 NOTE — Telephone Encounter (Signed)
Pt came in to see Dr. Reece Agar on 05-10-11 instead of [facility] over the weekend.

## 2011-06-08 ENCOUNTER — Telehealth: Payer: Self-pay | Admitting: Family Medicine

## 2011-06-08 DIAGNOSIS — E039 Hypothyroidism, unspecified: Secondary | ICD-10-CM

## 2011-06-08 DIAGNOSIS — E785 Hyperlipidemia, unspecified: Secondary | ICD-10-CM

## 2011-06-08 DIAGNOSIS — I10 Essential (primary) hypertension: Secondary | ICD-10-CM

## 2011-06-08 DIAGNOSIS — Z0000 Encounter for general adult medical examination without abnormal findings: Secondary | ICD-10-CM

## 2011-06-08 NOTE — Telephone Encounter (Signed)
Message copied by Judy Pimple on Tue Jun 08, 2011  8:39 PM ------      Message from: Baldomero Lamy      Created: Thu Jun 03, 2011  8:09 AM      Regarding: Cpx labs Wed 11/14       Please order  future cpx labs for pt's upcomming lab appt.      Thanks      Rodney Booze

## 2011-06-09 ENCOUNTER — Other Ambulatory Visit (INDEPENDENT_AMBULATORY_CARE_PROVIDER_SITE_OTHER): Payer: BC Managed Care – PPO

## 2011-06-09 DIAGNOSIS — Z0000 Encounter for general adult medical examination without abnormal findings: Secondary | ICD-10-CM

## 2011-06-09 DIAGNOSIS — E785 Hyperlipidemia, unspecified: Secondary | ICD-10-CM

## 2011-06-09 DIAGNOSIS — E039 Hypothyroidism, unspecified: Secondary | ICD-10-CM

## 2011-06-09 DIAGNOSIS — I10 Essential (primary) hypertension: Secondary | ICD-10-CM

## 2011-06-09 LAB — COMPREHENSIVE METABOLIC PANEL
Alkaline Phosphatase: 96 U/L (ref 39–117)
BUN: 13 mg/dL (ref 6–23)
Creatinine, Ser: 0.8 mg/dL (ref 0.4–1.2)
GFR: 79.85 mL/min (ref >60.00)
Glucose, Bld: 95 mg/dL (ref 70–99)
Sodium: 138 mEq/L (ref 135–145)
Total Bilirubin: 0.7 mg/dL (ref 0.3–1.2)

## 2011-06-09 LAB — CBC WITH DIFFERENTIAL/PLATELET
Basophils Relative: 0.4 % (ref 0.0–3.0)
Eosinophils Relative: 1.1 % (ref 0.0–5.0)
HCT: 40.7 % (ref 36.0–46.0)
Hemoglobin: 13.8 g/dL (ref 12.0–15.0)
Lymphs Abs: 1.9 10*3/uL (ref 0.7–4.0)
MCV: 95.8 fl (ref 78.0–100.0)
Monocytes Absolute: 0.8 10*3/uL (ref 0.1–1.0)
Neutro Abs: 9.3 10*3/uL — ABNORMAL HIGH (ref 1.4–7.7)
Platelets: 466 10*3/uL — ABNORMAL HIGH (ref 150.0–400.0)
WBC: 12.2 10*3/uL — ABNORMAL HIGH (ref 4.5–10.5)

## 2011-06-09 LAB — LIPID PANEL
Cholesterol: 204 mg/dL — ABNORMAL HIGH (ref 0–200)
Total CHOL/HDL Ratio: 3
Triglycerides: 106 mg/dL (ref 0.0–149.0)
VLDL: 21.2 mg/dL (ref 0.0–40.0)

## 2011-06-16 ENCOUNTER — Encounter: Payer: Self-pay | Admitting: Family Medicine

## 2011-06-16 ENCOUNTER — Ambulatory Visit (INDEPENDENT_AMBULATORY_CARE_PROVIDER_SITE_OTHER): Payer: BC Managed Care – PPO | Admitting: Family Medicine

## 2011-06-16 VITALS — BP 142/90 | HR 84 | Temp 98.5°F | Ht 61.25 in | Wt 143.0 lb

## 2011-06-16 DIAGNOSIS — E039 Hypothyroidism, unspecified: Secondary | ICD-10-CM

## 2011-06-16 DIAGNOSIS — Z0000 Encounter for general adult medical examination without abnormal findings: Secondary | ICD-10-CM

## 2011-06-16 DIAGNOSIS — E785 Hyperlipidemia, unspecified: Secondary | ICD-10-CM

## 2011-06-16 DIAGNOSIS — K044 Acute apical periodontitis of pulpal origin: Secondary | ICD-10-CM

## 2011-06-16 DIAGNOSIS — I10 Essential (primary) hypertension: Secondary | ICD-10-CM

## 2011-06-16 DIAGNOSIS — K047 Periapical abscess without sinus: Secondary | ICD-10-CM | POA: Insufficient documentation

## 2011-06-16 MED ORDER — LEVOTHYROXINE SODIUM 75 MCG PO TABS: 75.0000 ug | ORAL_TABLET | Freq: Every day | ORAL | Status: DC

## 2011-06-16 MED ORDER — HYDROCHLOROTHIAZIDE 25 MG PO TABS: ORAL_TABLET | ORAL | Status: DC

## 2011-06-16 MED ORDER — AMOXICILLIN-POT CLAVULANATE 875-125 MG PO TABS: 1.0000 | ORAL_TABLET | Freq: Two times a day (BID) | ORAL | Status: AC

## 2011-06-16 NOTE — Assessment & Plan Note (Signed)
bp high today- but pt is anxious about holiday tomorrow and running around  F/u 1 mo for re check  May need to change med if not imp  Rev lifestyle change

## 2011-06-16 NOTE — Patient Instructions (Signed)
Follow up in 1 month for visit to re check bp and also labs to check blood count Take augmentin for tooth infection Make appt with your dentist asap If you are interested in a shingles/zoster vaccine - call your insurance to check on coverage,( you should not get it within 1 month of other vaccines) , then call us for a prescription  for it to take to a pharmacy that gives the shot   at check out make appt for our next flu shot clinic  Do not forget to follow up with your gyn doctor

## 2011-06-16 NOTE — Assessment & Plan Note (Signed)
tsh stable and theraputic No clinical changes No change in dose Med refilled Rev lab with pt

## 2011-06-16 NOTE — Progress Notes (Signed)
Subjective:    Patient ID: Jocelyn Payne, female    DOB: 10/08/1950, 60 y.o.   MRN: [id number]  HPI Here for annual health mt exam and to rev chronic medical problems Is feeling great overall   Is having pain in a hip and wrist L- relatively chronic - learned to live with it  Old injuries from a fall  Sees ortho  L wrist is deformed and bigger   Had a bad experience with her blood draw - painful and big bruise     Wt is down 2 lb- is working on that   Pap- ? Last -- seen at [facility] -- has been over a year since last visit  She needs to make an appt  Probably 2004 may -- all was normal  Has not had a hysterectomy  Will f/u with them for pelvic and breast exam   Colon cancer screen- has never had a colonoscopy , and declines ifob   Zoster status-- is interested in vaccine- will call ins   Flu shot - wants to come back for flu shot clinic   Mam 8/12 Self exam - no lumps or changes   Tdap 10/12  HTN- bp is 142/90 first check  On hctz alone- took it today  Is stressed today  Has not checked outside office   Hypothyroid Lab Results  Component Value Date   TSH 2.51 06/09/2011   No clinical changes -hair or skin No change in synthroid   Lipids are fair with diet control Lab Results  Component Value Date   CHOL 204* 06/09/2011   CHOL 234* 11/11/2010   Lab Results  Component Value Date   HDL 74.60 06/09/2011   HDL 63.90 11/11/2010   No results found for this basename: [facility]   Lab Results  Component Value Date   TRIG 106.0 06/09/2011   TRIG 92.0 11/11/2010   Lab Results  Component Value Date   CHOLHDL 3 06/09/2011   CHOLHDL 4 11/11/2010   Lab Results  Component Value Date   LDLDIRECT 122.4 06/09/2011   LDLDIRECT 151.4 11/11/2010   overall improved from last check Stopped eating eggs and fried foods and drinking cokes   Diet - is good   Wbc and platelet high  Lab Results  Component Value Date   WBC 12.2* 06/09/2011   HGB 13.8 06/09/2011   HCT 40.7 06/09/2011   MCV 95.8 06/09/2011   PLT 466.0* 06/09/2011   is having dental problems -- and may have a tooth that is infected  Does have dental insurance and goes every 6 months  No fever No signs of infection  Had an animal bit - cat -- well over 6 weeks ago - no infx , healed well  Patient Active Problem List  Diagnoses  . ALLERGIC RHINITIS CAUSE UNSPECIFIED  . GLOBUS HYSTERICUS  . HYPERTENSION, ESSENTIAL  . HYPOTHYROIDISM  . HLD (hyperlipidemia)  . Tick bite  . Right ear impacted cerumen  . Animal bite of finger  . Routine general medical examination at a health care facility  . Tooth infection   Past Medical History  Diagnosis Date  . Allergic rhinitis, cause unspecified   . Conversion disorder   . HTN (hypertension)   . Hypothyroid   . OA (osteoarthritis)     left wrist from fracture   . HLD (hyperlipidemia)     high LDL   Past Surgical History  Procedure Date  . Wrist surgery 02/2008    right fracture  History  Substance Use Topics  . Smoking status: Former Smoker    Quit date: 10/24/2005  . Smokeless tobacco: Not on file  . Alcohol Use: Yes     6 beers 1 night/weekly   Family History  Problem Relation Age of Onset  . Hypertension Mother   . Cancer Father     Lung and esophageal  . Hypertension Brother   . Hypertension Sister   . Hypothyroidism Sister    No Known Allergies Current Outpatient Prescriptions on File Prior to Visit  Medication Sig Dispense Refill  . calcium carbonate (TUMS - DOSED IN MG ELEMENTAL CALCIUM) 500 MG chewable tablet Chew 2 tablets by mouth as needed.            Review of Systems Review of Systems  Constitutional: Negative for fever, appetite change, fatigue and unexpected weight change.  Eyes: Negative for pain and visual disturbance.  ENT pos for mouth pain from infected tooth Respiratory: Negative for cough and shortness of breath.   Cardiovascular: Negative for cp or palpitations    Gastrointestinal:  Negative for nausea, diarrhea and constipation.  Genitourinary: Negative for urgency and frequency.  Skin: Negative for pallor or rash   MSK pos for chronic back and hip pain , no joint changes Neurological: Negative for weakness, light-headedness, numbness and headaches.  Hematological: Negative for adenopathy. Does not bruise/bleed easily.  Psychiatric/Behavioral: Negative for dysphoric mood. The patient is not nervous/anxious.          Objective:   Physical Exam  Constitutional: She appears well-developed and well-nourished. No distress.  HENT:  Head: Normocephalic and atraumatic.  Mouth/Throat: Oropharynx is clear and moist.       R post molar with decay, no gum swelling but some redness   Eyes: Conjunctivae and EOM are normal. Pupils are equal, round, and reactive to light. No scleral icterus.  Neck: Normal range of motion. Neck supple. No JVD present. Carotid bruit is not present. No thyromegaly present.  Cardiovascular: Normal rate, regular rhythm, normal heart sounds and intact distal pulses.  Exam reveals no gallop.   Pulmonary/Chest: Effort normal and breath sounds normal. No respiratory distress. She has no wheezes. She exhibits no tenderness.  Abdominal: Soft. Bowel sounds are normal. She exhibits no distension, no abdominal bruit and no mass. There is no tenderness.  Musculoskeletal: She exhibits no edema and no tenderness.       Overall poor rom hips and LS   Lymphadenopathy:    She has no cervical adenopathy.  Neurological: She has normal reflexes. No cranial nerve deficit. She exhibits normal muscle tone. Coordination normal.  Skin: Skin is warm and dry. No rash noted. No erythema. No pallor.  Psychiatric: She has a normal mood and affect.          Assessment & Plan:

## 2011-06-16 NOTE — Assessment & Plan Note (Signed)
Improved with better diet  Disc goals for lipids and reasons to control them Rev labs with pt Rev low sat fat diet in detail  

## 2011-06-16 NOTE — Assessment & Plan Note (Signed)
Reviewed health habits including diet and exercise and skin cancer prevention Also reviewed health mt list, fam hx and immunizations  Will return for flu shot  F/u 1 mo for cbc/ HTN check

## 2011-06-16 NOTE — Assessment & Plan Note (Signed)
R molar is likely infected  Cbc with inc wbc and also platelet tx with augmentin  F/u dentist F/u 1 mo re check labs

## 2011-06-23 ENCOUNTER — Ambulatory Visit (INDEPENDENT_AMBULATORY_CARE_PROVIDER_SITE_OTHER): Payer: BC Managed Care – PPO

## 2011-06-23 DIAGNOSIS — Z23 Encounter for immunization: Secondary | ICD-10-CM

## 2011-07-09 ENCOUNTER — Other Ambulatory Visit: Payer: Self-pay | Admitting: Family Medicine

## 2011-07-09 DIAGNOSIS — K047 Periapical abscess without sinus: Secondary | ICD-10-CM

## 2011-07-16 ENCOUNTER — Other Ambulatory Visit: Payer: BC Managed Care – PPO

## 2011-07-23 ENCOUNTER — Ambulatory Visit: Payer: BC Managed Care – PPO | Admitting: Family Medicine

## 2011-08-11 ENCOUNTER — Ambulatory Visit (INDEPENDENT_AMBULATORY_CARE_PROVIDER_SITE_OTHER): Payer: BC Managed Care – PPO | Admitting: Family Medicine

## 2011-08-11 ENCOUNTER — Ambulatory Visit (INDEPENDENT_AMBULATORY_CARE_PROVIDER_SITE_OTHER)
Admission: RE | Admit: 2011-08-11 | Discharge: 2011-08-11 | Disposition: A | Discharge status: Home / Self Care | Payer: BC Managed Care – PPO | Source: Ambulatory Visit | Attending: Family Medicine | Admitting: Family Medicine

## 2011-08-11 ENCOUNTER — Encounter: Payer: Self-pay | Admitting: Family Medicine

## 2011-08-11 VITALS — BP 160/80 | HR 84 | Temp 98.0°F

## 2011-08-11 DIAGNOSIS — M25569 Pain in unspecified knee: Secondary | ICD-10-CM

## 2011-08-11 MED ORDER — MELOXICAM 15 MG PO TABS: 15.0000 mg | ORAL_TABLET | Freq: Every day | ORAL | Status: DC

## 2011-08-11 NOTE — Assessment & Plan Note (Signed)
Contusion to L knee after fall on patella X ray - no fracture - but waiting on radiol overread Ice/ elevation recommended mobic 15 mg daily with food  Update in several days or earlier if much worse

## 2011-08-11 NOTE — Progress Notes (Signed)
Subjective:    Patient ID: Jocelyn Payne, female    DOB: [birth year], 61 y.o.   MRN: [id number]  HPI Was walking at [facility] and tripped on uneven tile and fell flat on L knee  It hurt, and the staff made her sit a minute Had to fill out a report  Was done shopping - went home Fed her animals Sat down at 1 pm - put ice on it and it really started to swell Hurts most on top of knee cap  No twisting  She has been walking and able to bear wt No numbness or weakness  No meds for this   Patient Active Problem List  Diagnoses  . ALLERGIC RHINITIS CAUSE UNSPECIFIED  . GLOBUS HYSTERICUS  . HYPERTENSION, ESSENTIAL  . HYPOTHYROIDISM  . HLD (hyperlipidemia)  . Tick bite  . Right ear impacted cerumen  . Animal bite of finger  . Routine general medical examination at a health care facility  . Tooth infection  . Knee pain   Past Medical History  Diagnosis Date  . Allergic rhinitis, cause unspecified   . Conversion disorder   . HTN (hypertension)   . Hypothyroid   . OA (osteoarthritis)     left wrist from fracture   . HLD (hyperlipidemia)     high LDL   Past Surgical History  Procedure Date  . Wrist surgery 02/2008    right fracture   History  Substance Use Topics  . Smoking status: Former Smoker    Quit date: 10/24/2005  . Smokeless tobacco: Not on file  . Alcohol Use: Yes     6 beers 1 night/weekly   Family History  Problem Relation Age of Onset  . Hypertension Mother   . Cancer Father     Lung and esophageal  . Hypertension Brother   . Hypertension Sister   . Hypothyroidism Sister    No Known Allergies Current Outpatient Prescriptions on File Prior to Visit  Medication Sig Dispense Refill  . hydrochlorothiazide (HYDRODIURIL) 25 MG tablet Take one pill daily by mouth for blood pressure  90 tablet  3  . levothyroxine (SYNTHROID, LEVOTHROID) 75 MCG tablet Take 1 tablet (75 mcg total) by mouth daily.  90 tablet  3  . calcium carbonate (TUMS - DOSED IN MG ELEMENTAL  CALCIUM) 500 MG chewable tablet Chew 2 tablets by mouth as needed.             Review of Systems Review of Systems  Constitutional: Negative for fever, appetite change, fatigue and unexpected weight change.  Eyes: Negative for pain and visual disturbance.  Respiratory: Negative for cough and shortness of breath.   Cardiovascular: Negative for cp or palpitations    Gastrointestinal: Negative for nausea, diarrhea and constipation.  Genitourinary: Negative for urgency and frequency.  Skin: Negative for pallor or rash   MSK pos for knee pain and swelling and bruising  Neurological: Negative for weakness, light-headedness, numbness and headaches.  Hematological: Negative for adenopathy. Does not bruise/bleed easily.  Psychiatric/Behavioral: Negative for dysphoric mood. The patient is not nervous/anxious.          Objective:   Physical Exam  Constitutional: She appears well-developed and well-nourished.       Pt well appearing - sitting in wheelchair but able to walk  Eyes: Conjunctivae and EOM are normal. Pupils are equal, round, and reactive to light.  Cardiovascular: Normal rate, regular rhythm and normal heart sounds.   Pulmonary/Chest: Effort normal and breath sounds normal.  Musculoskeletal: She exhibits edema and tenderness.       Left knee: She exhibits decreased range of motion, swelling, ecchymosis and bony tenderness. She exhibits no effusion, no deformity, no erythema, no LCL laxity, normal patellar mobility and no MCL laxity. tenderness found. Patellar tendon tenderness noted.       L knee- swollen on top of patella and also ecchymosis there - tender on patella and patellar tendon  Limited flexion due to pain - past 90 degrees Nl extension Neg mcmurray No effusion  Stable  Can bear weight on it  Neurological: She is alert. She has normal reflexes. No sensory deficit.  Skin: Skin is warm and dry. There is erythema. No pallor.  Psychiatric: She has a normal mood and  affect.          Assessment & Plan:

## 2011-08-11 NOTE — Patient Instructions (Signed)
You have a contusion of the knee I will update you when the radiology reading returns  Ice 10 minutes on and off with elevation as much as possible  The swelling will get worse before it gets better  Update if not starting to improve in a week or if worsening   I sent px for mobic to your pharmacy

## 2011-08-30 ENCOUNTER — Encounter: Payer: Self-pay | Admitting: Family Medicine

## 2011-08-30 ENCOUNTER — Ambulatory Visit: Payer: BC Managed Care – PPO | Admitting: Family Medicine

## 2011-08-30 ENCOUNTER — Ambulatory Visit (INDEPENDENT_AMBULATORY_CARE_PROVIDER_SITE_OTHER): Payer: BC Managed Care – PPO | Admitting: Family Medicine

## 2011-08-30 VITALS — BP 130/80 | HR 86 | Temp 98.1°F | Wt 144.5 lb

## 2011-08-30 DIAGNOSIS — L239 Allergic contact dermatitis, unspecified cause: Secondary | ICD-10-CM | POA: Insufficient documentation

## 2011-08-30 DIAGNOSIS — L259 Unspecified contact dermatitis, unspecified cause: Secondary | ICD-10-CM

## 2011-08-30 MED ORDER — FLUOCINONIDE-E 0.05 % EX CREA: TOPICAL_CREAM | Freq: Two times a day (BID) | CUTANEOUS | Status: AC

## 2011-08-30 NOTE — Patient Instructions (Signed)
Good to see you. Use lidex as needed. Benadryl 25 mg twice daily as needed for itching (it will make you sleepy) Try to figure out any new triggers.

## 2011-08-30 NOTE — Progress Notes (Signed)
  Subjective:    Patient ID: Jocelyn Payne, female    DOB: Dec 08, 1950, 61 y.o.   MRN: [id number]  HPI 61 yo pt of Dr. Milinda Antis, new to me, here for rash since this morning.  Not sure she has changed any soaps or detergents. Does take care of donkeys, but does not think the feed or hay has changed.  Noticed it on her hands and was itchy. Then noticed it on legs, arms and chest.  No new medications.    It is itchy, not painful. No wheezing or SOB. Patient Active Problem List  Diagnoses  . ALLERGIC RHINITIS CAUSE UNSPECIFIED  . GLOBUS HYSTERICUS  . HYPERTENSION, ESSENTIAL  . HYPOTHYROIDISM  . HLD (hyperlipidemia)  . Tick bite  . Right ear impacted cerumen  . Animal bite of finger  . Routine general medical examination at a health care facility  . Tooth infection  . Knee pain   Past Medical History  Diagnosis Date  . Allergic rhinitis, cause unspecified   . Conversion disorder   . HTN (hypertension)   . Hypothyroid   . OA (osteoarthritis)     left wrist from fracture   . HLD (hyperlipidemia)     high LDL   Past Surgical History  Procedure Date  . Wrist surgery 02/2008    right fracture   History  Substance Use Topics  . Smoking status: Former Smoker    Quit date: 10/24/2005  . Smokeless tobacco: Not on file  . Alcohol Use: Yes     6 beers 1 night/weekly   Family History  Problem Relation Age of Onset  . Hypertension Mother   . Cancer Father     Lung and esophageal  . Hypertension Brother   . Hypertension Sister   . Hypothyroidism Sister    No Known Allergies Current Outpatient Prescriptions on File Prior to Visit  Medication Sig Dispense Refill  . hydrochlorothiazide (HYDRODIURIL) 25 MG tablet Take one pill daily by mouth for blood pressure  90 tablet  3  . levothyroxine (SYNTHROID, LEVOTHROID) 75 MCG tablet Take 1 tablet (75 mcg total) by mouth daily.  90 tablet  3   The PMH, PSH, Social History, Family History, Medications, and allergies have been  reviewed in [facility], and have been updated if relevant.    Review of Systems    See HPI  No CP Objective:   Physical Exam BP 130/80  Pulse 86  Temp(Src) 98.1 F (36.7 C) (Oral)  Wt 144 lb 8 oz (65.545 kg) Gen:  Alert, pleasant, NAD Skin:  Pos urticaria on chest, legs, arms with underlying erythema     Assessment & Plan:   1. Allergic dermatitis    New with unclear etiology. Will prescribe lidex cream, benadryl as needed. Advised looking for possible triggers. See pt instructions for details.

## 2012-08-11 ENCOUNTER — Other Ambulatory Visit: Payer: Self-pay | Admitting: Family Medicine

## 2012-08-11 NOTE — Telephone Encounter (Signed)
No recent appt and no future appt, ok to refill? 

## 2012-08-11 NOTE — Telephone Encounter (Signed)
Please set her up for a f/u in the spring and refil until then, thanks

## 2012-08-11 NOTE — Telephone Encounter (Signed)
appt scheduled for 11/01/12 and meds refilled until then

## 2012-08-28 ENCOUNTER — Other Ambulatory Visit: Payer: Self-pay | Admitting: Family Medicine

## 2012-10-17 ENCOUNTER — Ambulatory Visit (INDEPENDENT_AMBULATORY_CARE_PROVIDER_SITE_OTHER): Payer: BC Managed Care – PPO | Admitting: Family Medicine

## 2012-10-17 ENCOUNTER — Encounter: Payer: Self-pay | Admitting: Family Medicine

## 2012-10-17 VITALS — BP 140/80 | HR 80 | Temp 98.4°F

## 2012-10-17 DIAGNOSIS — E785 Hyperlipidemia, unspecified: Secondary | ICD-10-CM

## 2012-10-17 DIAGNOSIS — I10 Essential (primary) hypertension: Secondary | ICD-10-CM

## 2012-10-17 DIAGNOSIS — E039 Hypothyroidism, unspecified: Secondary | ICD-10-CM

## 2012-10-17 LAB — LDL CHOLESTEROL, DIRECT: Direct LDL: 148.7 mg/dL

## 2012-10-17 LAB — COMPREHENSIVE METABOLIC PANEL
ALT: 23 U/L (ref 0–35)
AST: 24 U/L (ref 0–37)
Alkaline Phosphatase: 109 U/L (ref 39–117)
Creatinine, Ser: 0.7 mg/dL (ref 0.4–1.2)
Total Bilirubin: 0.5 mg/dL (ref 0.3–1.2)

## 2012-10-17 LAB — LIPID PANEL
HDL: 57.9 mg/dL (ref >39.00)
Total CHOL/HDL Ratio: 4
Triglycerides: 166 mg/dL — ABNORMAL HIGH (ref 0.0–149.0)

## 2012-10-17 MED ORDER — HYDROCHLOROTHIAZIDE 25 MG PO TABS: 25.0000 mg | ORAL_TABLET | Freq: Every day | ORAL | Status: DC

## 2012-10-17 MED ORDER — LEVOTHYROXINE SODIUM 75 MCG PO TABS: 75.0000 ug | ORAL_TABLET | Freq: Every day | ORAL | Status: DC

## 2012-10-17 NOTE — Progress Notes (Signed)
Subjective:    Patient ID: Jocelyn Payne, female    DOB: [birth year], 62 y.o.   MRN: [id number]  HPI Here for f/u of chronic medical problems  Doing great - feels wonderful for the most part  She has aches and pains - and her L wrist has old injury / arthritis  Keeps going regardless   bp is up a bit today No cp or palpitations or headaches or edema  No side effects to medicines  BP Readings from Last 3 Encounters:  10/17/12 148/92  08/30/11 130/80  08/11/11 160/80    At home 157/85 and then 10 minutes later was 145/75 with HR of 74 - depends on what she does Was mopping the floor before she came    Hypothyroid Due for tsh check No change in skin or hair  Gained some weight over the winter due to inactivity with poor weather -has been unable to exercise  Now back to regular walking  On levothyroxine  Stress- mother had a stroke in nov/ very stressful - and is doing much better now  She is also a stress eater   Hx of high lipids- but very good ratio on last check  Lab Results  Component Value Date   CHOL 204* 06/09/2011   HDL 74.60 06/09/2011   LDLDIRECT 122.4 06/09/2011   TRIG 106.0 06/09/2011   CHOLHDL 3 06/09/2011    Patient Active Problem List  Diagnosis  . ALLERGIC RHINITIS CAUSE UNSPECIFIED  . GLOBUS HYSTERICUS  . HYPERTENSION, ESSENTIAL  . HYPOTHYROIDISM  . HLD (hyperlipidemia)  . Right ear impacted cerumen  . Animal bite of finger  . Routine general medical examination at a health care facility  . Knee pain  . Allergic dermatitis   Past Medical History  Diagnosis Date  . Allergic rhinitis, cause unspecified   . Conversion disorder   . HTN (hypertension)   . Hypothyroid   . OA (osteoarthritis)     left wrist from fracture   . HLD (hyperlipidemia)     high LDL   Past Surgical History  Procedure Laterality Date  . Wrist surgery  02/2008    right fracture   History  Substance Use Topics  . Smoking status: Former Smoker    Quit date:  10/24/2005  . Smokeless tobacco: Not on file  . Alcohol Use: Yes     Comment: 6 beers 1 night/weekly   Family History  Problem Relation Age of Onset  . Hypertension Mother   . Cancer Father     Lung and esophageal  . Hypertension Brother   . Hypertension Sister   . Hypothyroidism Sister    No Known Allergies Current Outpatient Prescriptions on File Prior to Visit  Medication Sig Dispense Refill  . hydrochlorothiazide (HYDRODIURIL) 25 MG tablet TAKE 1 TABLET BY MOUTH DAILY FOR BLOOD PRESSURE  90 tablet  0  . levothyroxine (SYNTHROID, LEVOTHROID) 75 MCG tablet TAKE 1 TABLET BY MOUTH DAILY  90 tablet  0   No current facility-administered medications on file prior to visit.    Review of Systems Review of Systems  Constitutional: Negative for fever, appetite change, fatigue and unexpected weight change.  Eyes: Negative for pain and visual disturbance.  Respiratory: Negative for cough and shortness of breath.   Cardiovascular: Negative for cp or palpitations    Gastrointestinal: Negative for nausea, diarrhea and constipation.  Genitourinary: Negative for urgency and frequency.  Skin: Negative for pallor or rash   MSK pos for aches and pains/  OA Neurological: Negative for weakness, light-headedness, numbness and headaches.  Hematological: Negative for adenopathy. Does not bruise/bleed easily.  Psychiatric/Behavioral: Negative for dysphoric mood. The patient is not nervous/anxious.         Objective:   Physical Exam  Constitutional: She appears well-developed and well-nourished. No distress.  HENT:  Head: Normocephalic and atraumatic.  Mouth/Throat: Oropharynx is clear and moist.  Eyes: Conjunctivae and EOM are normal. Pupils are equal, round, and reactive to light. Right eye exhibits no discharge. Left eye exhibits no discharge. No scleral icterus.  Neck: Normal range of motion. Neck supple. No JVD present. Carotid bruit is not present. No thyromegaly present.  Cardiovascular:  Normal rate, regular rhythm, normal heart sounds and intact distal pulses.  Exam reveals no gallop.   Pulmonary/Chest: Effort normal and breath sounds normal. No respiratory distress. She has no wheezes. She has no rales.  Abdominal: Soft. Bowel sounds are normal. She exhibits no distension, no abdominal bruit and no mass. There is no tenderness.  Musculoskeletal: She exhibits no edema.  Lymphadenopathy:    She has no cervical adenopathy.  Neurological: She is alert. She has normal reflexes. No cranial nerve deficit. She exhibits normal muscle tone. Coordination normal.  Skin: Skin is warm and dry. No rash noted. No erythema. No pallor.  Psychiatric: She has a normal mood and affect.          Assessment & Plan:

## 2012-10-17 NOTE — Assessment & Plan Note (Signed)
bp in fair control at this time (better on 2nd check) No changes needed  Disc lifstyle change with low sodium diet and exercise   Lab today

## 2012-10-17 NOTE — Assessment & Plan Note (Signed)
Lab today Rev low sat fat diet Rev goals for cholesterol

## 2012-10-17 NOTE — Assessment & Plan Note (Signed)
Lab today No changes clinically

## 2012-10-17 NOTE — Patient Instructions (Addendum)
Labs today  Start getting exercise again  Drink lots of water Alcohol - 1 drink per day maximum

## 2012-10-18 ENCOUNTER — Ambulatory Visit: Payer: BC Managed Care – PPO | Admitting: Family Medicine

## 2012-11-01 ENCOUNTER — Ambulatory Visit: Payer: BC Managed Care – PPO | Admitting: Family Medicine

## 2012-11-16 ENCOUNTER — Other Ambulatory Visit: Payer: Self-pay | Admitting: Family Medicine

## 2012-11-16 ENCOUNTER — Telehealth: Payer: Self-pay | Admitting: *Deleted

## 2012-11-16 NOTE — Telephone Encounter (Signed)
Received phone call from pt saying no refills left on med, I called pharmacy and verified both meds had refills on them and requested pharmacy to refill her meds

## 2013-01-02 ENCOUNTER — Telehealth: Payer: Self-pay | Admitting: Family Medicine

## 2013-01-02 DIAGNOSIS — E785 Hyperlipidemia, unspecified: Secondary | ICD-10-CM

## 2013-01-02 DIAGNOSIS — I10 Essential (primary) hypertension: Secondary | ICD-10-CM

## 2013-01-02 NOTE — Telephone Encounter (Signed)
Message copied by Judy Pimple on Tue Jan 02, 2013 10:02 PM ------      Message from: Alvina Chou      Created: Mon Dec 25, 2012  4:39 PM      Regarding: Lab orders for Wednesday, 6.11.14       Labs for f/u appt ------

## 2013-01-03 ENCOUNTER — Other Ambulatory Visit (INDEPENDENT_AMBULATORY_CARE_PROVIDER_SITE_OTHER): Payer: BC Managed Care – PPO

## 2013-01-03 DIAGNOSIS — E785 Hyperlipidemia, unspecified: Secondary | ICD-10-CM

## 2013-01-03 DIAGNOSIS — Z0000 Encounter for general adult medical examination without abnormal findings: Secondary | ICD-10-CM

## 2013-01-03 DIAGNOSIS — I10 Essential (primary) hypertension: Secondary | ICD-10-CM

## 2013-01-03 LAB — LIPID PANEL
Cholesterol: 214 mg/dL — ABNORMAL HIGH (ref 0–200)
Total CHOL/HDL Ratio: 4
VLDL: 28.8 mg/dL (ref 0.0–40.0)

## 2013-01-03 LAB — COMPREHENSIVE METABOLIC PANEL
Alkaline Phosphatase: 94 U/L (ref 39–117)
Glucose, Bld: 85 mg/dL (ref 70–99)
Sodium: 140 mEq/L (ref 135–145)
Total Bilirubin: 0.4 mg/dL (ref 0.3–1.2)
Total Protein: 8.2 g/dL (ref 6.0–8.3)

## 2013-01-03 NOTE — Addendum Note (Signed)
Addended by: Baldomero Lamy on: 01/03/2013 08:12 AM   Modules accepted: Orders

## 2013-01-05 ENCOUNTER — Encounter: Payer: Self-pay | Admitting: *Deleted

## 2013-01-10 ENCOUNTER — Ambulatory Visit: Payer: BC Managed Care – PPO | Admitting: Family Medicine

## 2013-11-18 ENCOUNTER — Other Ambulatory Visit: Payer: Self-pay | Admitting: Family Medicine

## 2013-11-19 NOTE — Telephone Encounter (Signed)
Please schedule f/u and refill until then  

## 2013-11-19 NOTE — Telephone Encounter (Signed)
Electronic refill request, no recent/future appt., please advise  

## 2013-11-22 NOTE — Telephone Encounter (Signed)
appt scheduled and med refilled 

## 2013-12-07 ENCOUNTER — Other Ambulatory Visit: Payer: Self-pay | Admitting: Family Medicine

## 2013-12-12 ENCOUNTER — Encounter: Payer: Self-pay | Admitting: Family Medicine

## 2013-12-12 ENCOUNTER — Ambulatory Visit (INDEPENDENT_AMBULATORY_CARE_PROVIDER_SITE_OTHER): Payer: BC Managed Care – PPO | Admitting: Family Medicine

## 2013-12-12 VITALS — BP 142/78 | HR 81 | Temp 98.7°F | Ht 62.0 in | Wt 151.5 lb

## 2013-12-12 DIAGNOSIS — I10 Essential (primary) hypertension: Secondary | ICD-10-CM

## 2013-12-12 DIAGNOSIS — E039 Hypothyroidism, unspecified: Secondary | ICD-10-CM

## 2013-12-12 LAB — COMPREHENSIVE METABOLIC PANEL
ALBUMIN: 4.2 g/dL (ref 3.5–5.2)
ALK PHOS: 86 U/L (ref 39–117)
ALT: 22 U/L (ref 0–35)
AST: 24 U/L (ref 0–37)
BILIRUBIN TOTAL: 0.4 mg/dL (ref 0.2–1.2)
BUN: 15 mg/dL (ref 6–23)
CO2: 31 mEq/L (ref 19–32)
Calcium: 10.1 mg/dL (ref 8.4–10.5)
Chloride: 99 mEq/L (ref 96–112)
Creatinine, Ser: 1.4 mg/dL — ABNORMAL HIGH (ref 0.4–1.2)
GFR: 42.05 mL/min — ABNORMAL LOW (ref >60.00)
Glucose, Bld: 80 mg/dL (ref 70–99)
Potassium: 3.3 mEq/L — ABNORMAL LOW (ref 3.5–5.1)
SODIUM: 138 meq/L (ref 135–145)
TOTAL PROTEIN: 7.9 g/dL (ref 6.0–8.3)

## 2013-12-12 LAB — CBC WITH DIFFERENTIAL/PLATELET
Basophils Absolute: 0 10*3/uL (ref 0.0–0.1)
Basophils Relative: 0.2 % (ref 0.0–3.0)
EOS ABS: 0.1 10*3/uL (ref 0.0–0.7)
Eosinophils Relative: 1.5 % (ref 0.0–5.0)
HCT: 41.8 % (ref 36.0–46.0)
Hemoglobin: 14.4 g/dL (ref 12.0–15.0)
Lymphocytes Relative: 22.1 % (ref 12.0–46.0)
Lymphs Abs: 2.1 10*3/uL (ref 0.7–4.0)
MCHC: 34.4 g/dL (ref 30.0–36.0)
MCV: 96 fl (ref 78.0–100.0)
MONO ABS: 0.8 10*3/uL (ref 0.1–1.0)
Monocytes Relative: 8.2 % (ref 3.0–12.0)
Neutro Abs: 6.3 10*3/uL (ref 1.4–7.7)
Neutrophils Relative %: 68 % (ref 43.0–77.0)
PLATELETS: 458 10*3/uL — AB (ref 150.0–400.0)
RBC: 4.36 Mil/uL (ref 3.87–5.11)
RDW: 13.2 % (ref 11.5–15.5)
WBC: 9.3 10*3/uL (ref 4.0–10.5)

## 2013-12-12 LAB — LIPID PANEL
CHOLESTEROL: 223 mg/dL — AB (ref 0–200)
HDL: 63.2 mg/dL (ref >39.00)
LDL CALC: 122 mg/dL — AB (ref 0–99)
Total CHOL/HDL Ratio: 4
Triglycerides: 188 mg/dL — ABNORMAL HIGH (ref 0.0–149.0)
VLDL: 37.6 mg/dL (ref 0.0–40.0)

## 2013-12-12 LAB — TSH: TSH: 3.45 u[IU]/mL (ref 0.35–4.50)

## 2013-12-12 MED ORDER — LISINOPRIL-HYDROCHLOROTHIAZIDE 10-12.5 MG PO TABS: 1.0000 | ORAL_TABLET | Freq: Every day | ORAL | Status: DC

## 2013-12-12 NOTE — Progress Notes (Signed)
Pre visit review using our clinic review tool, if applicable. No additional management support is needed unless otherwise documented below in the visit note. 

## 2013-12-12 NOTE — Progress Notes (Signed)
Subjective:    Patient ID: Jocelyn Payne, female    DOB: 11/16/1950, 63 y.o.   MRN: [id number]  HPI Here for f/u for HTN  Has been feeling wonderful / no problems  No headaches occ a little ankle swelling if very hot    BP Readings from Last 3 Encounters:  12/12/13 142/78  10/17/12 140/80  08/30/11 130/80    At home her readings vary  Some readings are as high as mid 035K systolic or as low as 093  At opth 818 systolic At derm for procedure 150s (basal cell removal) - has f/u for body check in Oct  Keeping a close eye onit   On hctz 25 mg   Hypothyroidism  Pt has no clinical changes- except for some hair loss (thinks this is hereditary however)  No change in energy level/ hair or skin/ edema and no tremor Lab Results  Component Value Date   TSH 3.38 10/17/2012     Due for a re check   Patient Active Problem List   Diagnosis Date Noted  . Knee pain 08/11/2011  . Routine general medical examination at a health care facility 06/08/2011  . HLD (hyperlipidemia) 12/16/2010  . HYPOTHYROIDISM 10/07/2010  . GLOBUS HYSTERICUS 09/23/2010  . ALLERGIC RHINITIS CAUSE UNSPECIFIED 08/12/2010  . HYPERTENSION, ESSENTIAL 08/12/2010   Past Medical History  Diagnosis Date  . Allergic rhinitis, cause unspecified   . Conversion disorder   . HTN (hypertension)   . Hypothyroid   . OA (osteoarthritis)     left wrist from fracture   . HLD (hyperlipidemia)     high LDL   Past Surgical History  Procedure Laterality Date  . Wrist surgery  02/2008    right fracture   History  Substance Use Topics  . Smoking status: Former Smoker    Quit date: 10/24/2005  . Smokeless tobacco: Not on file  . Alcohol Use: Yes     Comment: 6 beers 1 night/weekly   Family History  Problem Relation Age of Onset  . Hypertension Mother   . Cancer Father     Lung and esophageal  . Hypertension Brother   . Hypertension Sister   . Hypothyroidism Sister    No Known Allergies Current Outpatient  Prescriptions on File Prior to Visit  Medication Sig Dispense Refill  . hydrochlorothiazide (HYDRODIURIL) 25 MG tablet TAKE 1 TABLET (25 MG TOTAL) BY MOUTH DAILY.  90 tablet  0  . levothyroxine (SYNTHROID, LEVOTHROID) 75 MCG tablet TAKE 1 TABLET (75 MCG TOTAL) BY MOUTH DAILY.  90 tablet  0   No current facility-administered medications on file prior to visit.     Review of Systems Review of Systems  Constitutional: Negative for fever, appetite change, fatigue and unexpected weight change.  Eyes: Negative for pain and visual disturbance.  Respiratory: Negative for cough and shortness of breath.   Cardiovascular: Negative for cp or palpitations    Gastrointestinal: Negative for nausea, diarrhea and constipation.  Genitourinary: Negative for urgency and frequency.  Skin: Negative for pallor or rash   Neurological: Negative for weakness, light-headedness, numbness and headaches.  Hematological: Negative for adenopathy. Does not bruise/bleed easily.  Psychiatric/Behavioral: Negative for dysphoric mood. The patient is not nervous/anxious.         Objective:   Physical Exam  Constitutional: She appears well-developed and well-nourished. No distress.  HENT:  Head: Normocephalic and atraumatic.  Mouth/Throat: Oropharynx is clear and moist.  Eyes: Conjunctivae and EOM are normal. Pupils are  equal, round, and reactive to light. Left eye exhibits no discharge. No scleral icterus.  Neck: Normal range of motion. Neck supple. No JVD present. Carotid bruit is not present. No thyromegaly present.  Cardiovascular: Normal rate, regular rhythm, normal heart sounds and intact distal pulses.  Exam reveals no gallop.   Pulmonary/Chest: Effort normal and breath sounds normal. No respiratory distress. She has no wheezes. She has no rales.  Abdominal: Soft. Bowel sounds are normal. She exhibits no distension and no mass. There is no tenderness.  Musculoskeletal: She exhibits no edema.  Lymphadenopathy:     She has no cervical adenopathy.  Neurological: She is alert. She has normal reflexes. No cranial nerve deficit. She exhibits normal muscle tone. Coordination normal.  No tremor   Skin: Skin is warm and dry. No rash noted. No erythema. No pallor.  Psychiatric: She has a normal mood and affect.          Assessment & Plan:

## 2013-12-12 NOTE — Patient Instructions (Signed)
Hold the hctz that you have  Start lisinopri hct 10-12.5 one pill daily in the am  Eat healthy/ get exercise/ drink water - keep working on reducing alcohol intake (goal 1 drink per day or less) Labs today Follow up in 6-8 weeks to re check bp

## 2013-12-13 ENCOUNTER — Encounter: Payer: Self-pay | Admitting: Family Medicine

## 2013-12-13 ENCOUNTER — Telehealth: Payer: Self-pay | Admitting: Family Medicine

## 2013-12-13 NOTE — Telephone Encounter (Signed)
Relevant patient education mailed to patient.  

## 2013-12-13 NOTE — Assessment & Plan Note (Signed)
Hypothyroidism  Pt has no clinical changes No change in energy level/ hair or skin/ edema and no tremor Lab Results  Component Value Date   TSH 3.45 12/12/2013    Lab today to update tsh

## 2013-12-13 NOTE — Assessment & Plan Note (Addendum)
bp in labile control Will change to lisinoprol hct 10-12.5 BP Readings from Last 1 Encounters:  12/12/13 142/78   Disc lifstyle change with low sodium diet and exercise  Lab today F/u planned

## 2013-12-20 ENCOUNTER — Telehealth: Payer: Self-pay

## 2013-12-20 NOTE — Telephone Encounter (Signed)
Pt left v/m; pt was seen on 12/12/13 and pt was started on new BP med, lisinopril-HCTZ. 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 Pt wants to know if should stop lisinopril HCTZ and restart HCTZ. Pt request cb.[facility]

## 2013-12-20 NOTE — Telephone Encounter (Signed)
That will just get her back to where she was when she thought bp was too high  Another option is to call her in lisinopril without the hctz and see if we can get better control without side eff How is her bp running on the lisinopril hct now ?

## 2013-12-24 MED ORDER — AMLODIPINE BESYLATE 2.5 MG PO TABS: 2.5000 mg | ORAL_TABLET | Freq: Every day | ORAL | Status: DC

## 2013-12-24 NOTE — Telephone Encounter (Signed)
Pt notified to cut HCTZ to 1/2 tab qd, med list updated and pt advise to drink enough water, pt verbalized understanding

## 2013-12-24 NOTE — Telephone Encounter (Signed)
Pt notified of Dr. Marliss Coots recommendations/comments. Pt notified new Rx sent to pharmacy and if she has any side eff to call us.  Pt wanted to make sure you were okay with her taking the HCTZ, since her Creatinine was up last time she had labs

## 2013-12-24 NOTE — Telephone Encounter (Signed)
Ok- we will stop that  Go back to the hctz alone that she used to take  Lets add low dose amlodipine instead - I will send that to her pharmacy   So she will take hctz and amlodipine (separate meds)

## 2013-12-24 NOTE — Telephone Encounter (Signed)
Pt said she only took the lisinopril once and when she had the really bad dizziness she stopped the medication and never took it again. Pt hasn't checked her BP since her appt here. Pt said she is willing to try the lisinopril with out the HCTZ if that is what you recommend but she is nervous that it would cause the dizziness too, please advise

## 2013-12-24 NOTE — Telephone Encounter (Signed)
Good point- cut the hctz in 1/2 and take just the half pill daily  Please change on med list  Lets see how bp looks at her f/u  Make sure to drink enough water

## 2014-01-21 ENCOUNTER — Telehealth: Payer: Self-pay | Admitting: Family Medicine

## 2014-01-21 MED ORDER — LEVOTHYROXINE SODIUM 75 MCG PO TABS: ORAL_TABLET | ORAL | Status: DC

## 2014-01-21 MED ORDER — HYDROCHLOROTHIAZIDE 25 MG PO TABS: ORAL_TABLET | ORAL | Status: DC

## 2014-01-21 NOTE — Telephone Encounter (Signed)
Rx refilled and pt notified

## 2014-01-21 NOTE — Telephone Encounter (Signed)
Pt is going out of town for 3 weeks with her family.  She had to cancel her 7/1 appt but will come to see Dr. Glori Bickers when she returns from out of town.  She needs a refill on all of her meds to get her through her trip.  She will be leaving on 7/1. [facility]

## 2014-01-23 ENCOUNTER — Ambulatory Visit: Payer: BC Managed Care – PPO | Admitting: Family Medicine

## 2014-02-19 ENCOUNTER — Other Ambulatory Visit: Payer: Self-pay | Admitting: *Deleted

## 2014-02-19 MED ORDER — AMLODIPINE BESYLATE 2.5 MG PO TABS: 2.5000 mg | ORAL_TABLET | Freq: Every day | ORAL | Status: DC

## 2014-02-19 NOTE — Telephone Encounter (Signed)
Received fax saying pt wants 90 day supply of Rx, done

## 2014-03-12 ENCOUNTER — Other Ambulatory Visit: Payer: Self-pay | Admitting: Family Medicine

## 2014-03-27 ENCOUNTER — Encounter: Payer: Self-pay | Admitting: Family Medicine

## 2014-03-27 ENCOUNTER — Ambulatory Visit (INDEPENDENT_AMBULATORY_CARE_PROVIDER_SITE_OTHER): Payer: BC Managed Care – PPO | Admitting: Family Medicine

## 2014-03-27 VITALS — BP 140/90 | HR 72 | Temp 98.1°F | Ht 62.0 in | Wt 147.2 lb

## 2014-03-27 DIAGNOSIS — L255 Unspecified contact dermatitis due to plants, except food: Secondary | ICD-10-CM

## 2014-03-27 MED ORDER — PREDNISONE 10 MG PO TABS: ORAL_TABLET | ORAL | Status: DC

## 2014-03-27 NOTE — Progress Notes (Signed)
Pre visit review using our clinic review tool, if applicable. No additional management support is needed unless otherwise documented below in the visit note. 

## 2014-03-27 NOTE — Assessment & Plan Note (Signed)
Suspect from poison oak or ivy Diffusely on legs -worst on L ankle Disc symptomatic care - see instructions on AVS - calamine/antihistamine otc  Disc prev infx with soap and water and to keep cool pred taper 30 mg -disc side eff in detail  Update if not starting to improve in a week or if worsening

## 2014-03-27 NOTE — Progress Notes (Signed)
Subjective:    Patient ID: Jocelyn Payne, female    DOB: 09/27/1950, 63 y.o.   MRN: [id number]  HPI Here for poison oak   She was out harvesting watermelon and squash  About 10 days ago  She broke out within 2 d of exposure   Recognized [place] creeper but did not catch the poison ivy   L ankle is the worst area    Very itchy  Using a lot of calamine lotion  She has not taken an antihistamine   Patient Active Problem List   Diagnosis Date Noted  . Routine general medical examination at a health care facility 06/08/2011  . HLD (hyperlipidemia) 12/16/2010  . HYPOTHYROIDISM 10/07/2010  . ALLERGIC RHINITIS CAUSE UNSPECIFIED 08/12/2010  . HYPERTENSION, ESSENTIAL 08/12/2010   Past Medical History  Diagnosis Date  . Allergic rhinitis, cause unspecified   . Conversion disorder   . HTN (hypertension)   . Hypothyroid   . OA (osteoarthritis)     left wrist from fracture   . HLD (hyperlipidemia)     high LDL   Past Surgical History  Procedure Laterality Date  . Wrist surgery  02/2008    right fracture   History  Substance Use Topics  . Smoking status: Former Smoker    Quit date: 10/24/2005  . Smokeless tobacco: Never Used  . Alcohol Use: Yes     Comment: 6 beers 1 night/weekly   Family History  Problem Relation Age of Onset  . Hypertension Mother   . Cancer Father     Lung and esophageal  . Hypertension Brother   . Hypertension Sister   . Hypothyroidism Sister    No Known Allergies Current Outpatient Prescriptions on File Prior to Visit  Medication Sig Dispense Refill  . amLODipine (NORVASC) 2.5 MG tablet Take 1 tablet (2.5 mg total) by mouth daily.  90 tablet  1  . levothyroxine (SYNTHROID, LEVOTHROID) 75 MCG tablet TAKE 1 TABLET (75 MCG TOTAL) BY MOUTH DAILY.  90 tablet  0   No current facility-administered medications on file prior to visit.      Review of Systems    Review of Systems  Constitutional: Negative for fever, appetite change, fatigue and  unexpected weight change.  Eyes: Negative for pain and visual disturbance.  Respiratory: Negative for cough and shortness of breath.   Cardiovascular: Negative for cp or palpitations    Gastrointestinal: Negative for nausea, diarrhea and constipation.  Genitourinary: Negative for urgency and frequency.  Skin: Negative for pallor and pos for  rash   Neurological: Negative for weakness, light-headedness, numbness and headaches.  Hematological: Negative for adenopathy. Does not bruise/bleed easily.  Psychiatric/Behavioral: Negative for dysphoric mood. The patient is not nervous/anxious.      Objective:   Physical Exam  Constitutional: She appears well-developed and well-nourished. No distress.  Eyes: Conjunctivae and EOM are normal. Pupils are equal, round, and reactive to light. Right eye exhibits no discharge. Left eye exhibits no discharge.  Neck: Normal range of motion. Neck supple.  Cardiovascular: Normal rate and regular rhythm.   Pulmonary/Chest: Effort normal and breath sounds normal. She has no wheezes.  Lymphadenopathy:    She has no cervical adenopathy.  Neurological: She is alert.  Skin: Skin is warm and dry. Rash noted. No pallor.  Plant dermatitis rash- on L ankle and small areas on both upper legs -clusters of tiny vesicles (some in linear pattern) No signs of bacterial infx   Psychiatric: She has a normal mood  and affect.          Assessment & Plan:   Problem List Items Addressed This Visit     Musculoskeletal and Integument   Plant dermatitis - Primary     Suspect from poison oak or ivy Diffusely on legs -worst on L ankle Disc symptomatic care - see instructions on AVS - calamine/antihistamine otc  Disc prev infx with soap and water and to keep cool pred taper 30 mg -disc side eff in detail  Update if not starting to improve in a week or if worsening

## 2014-03-27 NOTE — Patient Instructions (Signed)
Take prednisone as directed  You can continue caladryl and keep rash cool and clean  Zyrtec or allegra or claritin over the counter are all antihistamines that may help with itch (you can choose one) In addition - you can also take benadryl at night  Update if not starting to improve in a week or if worsening

## 2014-06-14 ENCOUNTER — Encounter: Payer: Self-pay | Admitting: Family Medicine

## 2014-06-14 ENCOUNTER — Ambulatory Visit (INDEPENDENT_AMBULATORY_CARE_PROVIDER_SITE_OTHER): Payer: BC Managed Care – PPO | Admitting: Family Medicine

## 2014-06-14 VITALS — BP 142/86 | HR 81 | Temp 99.6°F | Ht 62.0 in | Wt 147.5 lb

## 2014-06-14 DIAGNOSIS — J069 Acute upper respiratory infection, unspecified: Secondary | ICD-10-CM | POA: Insufficient documentation

## 2014-06-14 DIAGNOSIS — B9789 Other viral agents as the cause of diseases classified elsewhere: Principal | ICD-10-CM

## 2014-06-14 DIAGNOSIS — R509 Fever, unspecified: Secondary | ICD-10-CM

## 2014-06-14 LAB — POCT INFLUENZA A/B
Influenza A, POC: NEGATIVE
Influenza B, POC: NEGATIVE

## 2014-06-14 NOTE — Progress Notes (Signed)
Subjective:    Patient ID: Jocelyn Payne, female    DOB: [birth year], 63 y.o.   MRN: [id number]  HPI Here with uri symptoms  Tuesday- headache/achey and feeling bad in general  Cough and congestion   Temp went up to 100.2 this am 1 advil last night   99.6 now   Clear mucous from nose  Cough is dry  Some mild raw throat from mouth breathing   Headache in top of head  No neck stiffness  Neg rapid flu test today   Patient Active Problem List   Diagnosis Date Noted  . Plant dermatitis 03/27/2014  . Routine general medical examination at a health care facility 06/08/2011  . HLD (hyperlipidemia) 12/16/2010  . HYPOTHYROIDISM 10/07/2010  . ALLERGIC RHINITIS CAUSE UNSPECIFIED 08/12/2010  . HYPERTENSION, ESSENTIAL 08/12/2010   Past Medical History  Diagnosis Date  . Allergic rhinitis, cause unspecified   . Conversion disorder   . HTN (hypertension)   . Hypothyroid   . OA (osteoarthritis)     left wrist from fracture   . HLD (hyperlipidemia)     high LDL   Past Surgical History  Procedure Laterality Date  . Wrist surgery  02/2008    right fracture   History  Substance Use Topics  . Smoking status: Former Smoker    Quit date: 10/24/2005  . Smokeless tobacco: Never Used  . Alcohol Use: Yes     Comment: 6 beers 1 night/weekly   Family History  Problem Relation Age of Onset  . Hypertension Mother   . Cancer Father     Lung and esophageal  . Hypertension Brother   . Hypertension Sister   . Hypothyroidism Sister    No Known Allergies Current Outpatient Prescriptions on File Prior to Visit  Medication Sig Dispense Refill  . amLODipine (NORVASC) 2.5 MG tablet Take 1 tablet (2.5 mg total) by mouth daily. 90 tablet 1  . levothyroxine (SYNTHROID, LEVOTHROID) 75 MCG tablet TAKE 1 TABLET (75 MCG TOTAL) BY MOUTH DAILY. 90 tablet 0   No current facility-administered medications on file prior to visit.      Review of Systems Review of Systems  Constitutional: pos  for low grade temp/ malaise/ fatigue ENT pos for cong and rhinorrhea and neg for facial pain   Eyes: Negative for pain and visual disturbance.  Respiratory: Negative for wheeze and shortness of breath.   Cardiovascular: Negative for cp or palpitations    Gastrointestinal: Negative for nausea, diarrhea and constipation.  Genitourinary: Negative for urgency and frequency.  Skin: Negative for pallor or rash   Neurological: Negative for weakness, light-headedness, numbness and headaches.  Hematological: Negative for adenopathy. Does not bruise/bleed easily.  Psychiatric/Behavioral: Negative for dysphoric mood. The patient is not nervous/anxious.         Objective:   Physical Exam  Constitutional: She appears well-developed and well-nourished. No distress.  Fatigued   HENT:  Head: Normocephalic and atraumatic.  Right Ear: External ear normal.  Left Ear: External ear normal.  Mouth/Throat: Oropharynx is clear and moist.  Nares are injected and congested   Clear rhinorrhea  No facial tenderness Throat is clear   Eyes: Conjunctivae and EOM are normal. Pupils are equal, round, and reactive to light. No scleral icterus.  Neck: Normal range of motion. Neck supple. Carotid bruit is not present.  Cardiovascular: Normal rate, regular rhythm, normal heart sounds and intact distal pulses.  Exam reveals no gallop.   Pulmonary/Chest: Effort normal and breath sounds normal.  No respiratory distress. She has no wheezes. She has no rales.  Lymphadenopathy:    She has no cervical adenopathy.  Neurological: She is alert. She has normal reflexes. No cranial nerve deficit.  Skin: Skin is warm and dry. No rash noted. No erythema. No pallor.  Psychiatric: She has a normal mood and affect.          Assessment & Plan:   Problem List Items Addressed This Visit      Respiratory   Viral URI with cough    Disc symptomatic care - see instructions on AVS  Mucinex/ fever control fluids and rest  Update  if not starting to improve in a week or if worsening    Reassuring exam  Rapid flu test neg      Other Visit Diagnoses    Fever, unspecified fever cause    -  Primary    Relevant Orders       POCT Influenza A/B (Completed)

## 2014-06-14 NOTE — Assessment & Plan Note (Signed)
Disc symptomatic care - see instructions on AVS  Mucinex/ fever control fluids and rest  Update if not starting to improve in a week or if worsening    Reassuring exam  Rapid flu test neg

## 2014-06-14 NOTE — Patient Instructions (Signed)
You have a viral upper tract infection  Drink lots of water Ibuprofen for fever /chills/aches as needed  For runny nose - zyrtec  For congestion - mucinex  DM   (not D but DM is ok ) , plan mucinex is ok also  Update if not starting to improve in a week or if worsening      Upper Respiratory Infection, Adult An upper respiratory infection (URI) is also sometimes known as the common cold. The upper respiratory tract includes the nose, sinuses, throat, trachea, and bronchi. Bronchi are the airways leading to the lungs. Most people improve within 1 week, but symptoms can last up to 2 weeks. A residual cough may last even longer.  CAUSES Many different viruses can infect the tissues lining the upper respiratory tract. The tissues become irritated and inflamed and often become very moist. Mucus production is also common. A cold is contagious. You can easily spread the virus to others by oral contact. This includes kissing, sharing a glass, coughing, or sneezing. Touching your mouth or nose and then touching a surface, which is then touched by another person, can also spread the virus. SYMPTOMS  Symptoms typically develop 1 to 3 days after you come in contact with a cold virus. Symptoms vary from person to person. They may include:  Runny nose.  Sneezing.  Nasal congestion.  Sinus irritation.  Sore throat.  Loss of voice (laryngitis).  Cough.  Fatigue.  Muscle aches.  Loss of appetite.  Headache.  Low-grade fever. DIAGNOSIS  You might diagnose your own cold based on familiar symptoms, since most people get a cold 2 to 3 times a year. Your caregiver can confirm this based on your exam. Most importantly, your caregiver can check that your symptoms are not due to another disease such as strep throat, sinusitis, pneumonia, asthma, or epiglottitis. Blood tests, throat tests, and X-rays are not necessary to diagnose a common cold, but they may sometimes be helpful in excluding other more  serious diseases. Your caregiver will decide if any further tests are required. RISKS AND COMPLICATIONS  You may be at risk for a more severe case of the common cold if you smoke cigarettes, have chronic heart disease (such as heart failure) or lung disease (such as asthma), or if you have a weakened immune system. The very young and very old are also at risk for more serious infections. Bacterial sinusitis, middle ear infections, and bacterial pneumonia can complicate the common cold. The common cold can worsen asthma and chronic obstructive pulmonary disease (COPD). Sometimes, these complications can require emergency medical care and may be life-threatening. PREVENTION  The best way to protect against getting a cold is to practice good hygiene. Avoid oral or hand contact with people with cold symptoms. Wash your hands often if contact occurs. There is no clear evidence that vitamin C, vitamin E, echinacea, or exercise reduces the chance of developing a cold. However, it is always recommended to get plenty of rest and practice good nutrition. TREATMENT  Treatment is directed at relieving symptoms. There is no cure. Antibiotics are not effective, because the infection is caused by a virus, not by bacteria. Treatment may include:  Increased fluid intake. Sports drinks offer valuable electrolytes, sugars, and fluids.  Breathing heated mist or steam (vaporizer or shower).  Eating chicken soup or other clear broths, and maintaining good nutrition.  Getting plenty of rest.  Using gargles or lozenges for comfort.  Controlling fevers with ibuprofen or acetaminophen as directed  by your caregiver.  Increasing usage of your inhaler if you have asthma. Zinc gel and zinc lozenges, taken in the first 24 hours of the common cold, can shorten the duration and lessen the severity of symptoms. Pain medicines may help with fever, muscle aches, and throat pain. A variety of non-prescription medicines are  available to treat congestion and runny nose. Your caregiver can make recommendations and may suggest nasal or lung inhalers for other symptoms.  HOME CARE INSTRUCTIONS   Only take over-the-counter or prescription medicines for pain, discomfort, or fever as directed by your caregiver.  Use a warm mist humidifier or inhale steam from a shower to increase air moisture. This may keep secretions moist and make it easier to breathe.  Drink enough water and fluids to keep your urine clear or pale yellow.  Rest as needed.  Return to work when your temperature has returned to normal or as your caregiver advises. You may need to stay home longer to avoid infecting others. You can also use a face mask and careful hand washing to prevent spread of the virus. SEEK MEDICAL CARE IF:   After the first few days, you feel you are getting worse rather than better.  You need your caregiver's advice about medicines to control symptoms.  You develop chills, worsening shortness of breath, or brown or red sputum. These may be signs of pneumonia.  You develop yellow or brown nasal discharge or pain in the face, especially when you bend forward. These may be signs of sinusitis.  You develop a fever, swollen neck glands, pain with swallowing, or white areas in the back of your throat. These may be signs of strep throat. SEEK IMMEDIATE MEDICAL CARE IF:   You have a fever.  You develop severe or persistent headache, ear pain, sinus pain, or chest pain.  You develop wheezing, a prolonged cough, cough up blood, or have a change in your usual mucus (if you have chronic lung disease).  You develop sore muscles or a stiff neck. Document Released: 01/05/2001 Document Revised: 10/04/2011 Document Reviewed: 10/17/2013 [facility] Patient Information 2015 [place], [place]. This information is not intended to replace advice given to you by your health care provider. Make sure you discuss any questions you have with your  health care provider.

## 2014-06-14 NOTE — Progress Notes (Signed)
Pre visit review using our clinic review tool, if applicable. No additional management support is needed unless otherwise documented below in the visit note. 

## 2014-07-10 ENCOUNTER — Telehealth: Payer: Self-pay | Admitting: Family Medicine

## 2014-07-10 MED ORDER — AZITHROMYCIN 250 MG PO TABS: ORAL_TABLET | ORAL | Status: DC

## 2014-07-10 NOTE — Telephone Encounter (Signed)
Pt called states her head and ears are still stopped up. She was seen 06/14/14. She states she felt better with the decongestants but she never really "got over it" and the sxs are starting to come back.   [facility]

## 2014-07-10 NOTE — Telephone Encounter (Signed)
Please call in zpack I wonder if she is developing a sinus infx  F/u if no imp or if worse

## 2014-07-10 NOTE — Telephone Encounter (Signed)
Rx sent to pharmacy and pt notified and advise of Dr. Marliss Coots comments

## 2014-07-22 ENCOUNTER — Telehealth: Payer: Self-pay | Admitting: Family Medicine

## 2014-07-22 NOTE — Telephone Encounter (Signed)
Pt said she doesn't have a ringing in her ears they just feel stopped up, pt requested to see you to make sure they didn't need to be flushed. Pt said she never had a ringing in ears so appt scheduled with Dr. Glori Bickers tomorrow at 2:15

## 2014-07-22 NOTE — Telephone Encounter (Signed)
I will see her then  

## 2014-07-22 NOTE — Telephone Encounter (Signed)
I want to set her up with ENT- they can eval/ address the ringing in ears (I expected it to improve) and also any ear wax issues Let me know if agreeable

## 2014-07-22 NOTE — Telephone Encounter (Signed)
Pt finished z pac last Sunday 07/14/14 and pt ears are still ringing. Would like something else called in? Pt also may thing she has wax build up since they have been ringing for so long  [facility]

## 2014-07-22 NOTE — Telephone Encounter (Signed)
Left message with spouse to have pt call office back

## 2014-07-23 ENCOUNTER — Encounter: Payer: Self-pay | Admitting: Family Medicine

## 2014-07-23 ENCOUNTER — Ambulatory Visit (INDEPENDENT_AMBULATORY_CARE_PROVIDER_SITE_OTHER): Payer: BC Managed Care – PPO | Admitting: Family Medicine

## 2014-07-23 VITALS — BP 144/78 | HR 70 | Temp 98.3°F | Ht 62.0 in | Wt 149.5 lb

## 2014-07-23 DIAGNOSIS — J019 Acute sinusitis, unspecified: Secondary | ICD-10-CM | POA: Insufficient documentation

## 2014-07-23 DIAGNOSIS — J0100 Acute maxillary sinusitis, unspecified: Secondary | ICD-10-CM

## 2014-07-23 MED ORDER — FLUTICASONE PROPIONATE 50 MCG/ACT NA SUSP: 2.0000 | Freq: Every day | NASAL | Status: DC

## 2014-07-23 MED ORDER — AMOXICILLIN-POT CLAVULANATE 875-125 MG PO TABS: 1.0000 | ORAL_TABLET | Freq: Two times a day (BID) | ORAL | Status: DC

## 2014-07-23 NOTE — Patient Instructions (Signed)
Drink lots of fluids  Take mucinex if needed  Take augmentin as directed flonase for ears - for at least 2 weeks  Update if not starting to improve in a week or if worsening

## 2014-07-23 NOTE — Assessment & Plan Note (Signed)
Ongoing uri symptoms with nasal congestion - since before thanksgiving  no resp to zpak tx with augmentin Fluids/ rest/mucinex Trial of flonase for ETD  Update if not starting to improve in a week or if worsening

## 2014-07-23 NOTE — Progress Notes (Signed)
Subjective:    Patient ID: Jocelyn Payne, female    DOB: [birth year], 63 y.o.   MRN: [id number]  HPI Here with ear complaint  They feel full -cannot hear well  Also throb-not pain   Had a cold since several weeks before TG  Bad cough - productive - phlegm - green or clear  Nose is congested also - mostly clear d/c but green in am  No facial pain   Kids keep exposing her to germs in the house/ esp with the holiday   Fever on and off   Patient Active Problem List   Diagnosis Date Noted  . Viral URI with cough 06/14/2014  . Plant dermatitis 03/27/2014  . Routine general medical examination at a health care facility 06/08/2011  . HLD (hyperlipidemia) 12/16/2010  . HYPOTHYROIDISM 10/07/2010  . ALLERGIC RHINITIS CAUSE UNSPECIFIED 08/12/2010  . HYPERTENSION, ESSENTIAL 08/12/2010   Past Medical History  Diagnosis Date  . Allergic rhinitis, cause unspecified   . Conversion disorder   . HTN (hypertension)   . Hypothyroid   . OA (osteoarthritis)     left wrist from fracture   . HLD (hyperlipidemia)     high LDL   Past Surgical History  Procedure Laterality Date  . Wrist surgery  02/2008    right fracture   History  Substance Use Topics  . Smoking status: Former Smoker    Quit date: 10/24/2005  . Smokeless tobacco: Never Used  . Alcohol Use: Yes     Comment: 6 beers 1 night/weekly   Family History  Problem Relation Age of Onset  . Hypertension Mother   . Cancer Father     Lung and esophageal  . Hypertension Brother   . Hypertension Sister   . Hypothyroidism Sister    No Known Allergies Current Outpatient Prescriptions on File Prior to Visit  Medication Sig Dispense Refill  . amLODipine (NORVASC) 2.5 MG tablet Take 1 tablet (2.5 mg total) by mouth daily. 90 tablet 1  . levothyroxine (SYNTHROID, LEVOTHROID) 75 MCG tablet TAKE 1 TABLET (75 MCG TOTAL) BY MOUTH DAILY. 90 tablet 0   No current facility-administered medications on file prior to visit.     Review of  Systems Review of Systems  Constitutional: pos for fatigue and malaise  ENT pos for cong and rhinorrhea and ear discomfort /neg forST Eyes: Negative for pain and visual disturbance.  Respiratory: Negative for wheeze  and shortness of breath.   Cardiovascular: Negative for cp or palpitations    Gastrointestinal: Negative for nausea, diarrhea and constipation.  Genitourinary: Negative for urgency and frequency.  Skin: Negative for pallor or rash   Neurological: Negative for weakness, light-headedness, numbness and headaches.  Hematological: Negative for adenopathy. Does not bruise/bleed easily.  Psychiatric/Behavioral: Negative for dysphoric mood. The patient is not nervous/anxious.         Objective:   Physical Exam  Constitutional: She appears well-developed and well-nourished. No distress.  HENT:  Head: Normocephalic and atraumatic.  Right Ear: External ear normal.  Left Ear: External ear normal.  Mouth/Throat: Oropharynx is clear and moist. No oropharyngeal exudate.  Nares are injected and congested  Mild maxillary sinus tenderness TMs are dull /no erythema or bulging  Throat is clear   Eyes: Conjunctivae and EOM are normal. Pupils are equal, round, and reactive to light. Right eye exhibits no discharge. Left eye exhibits no discharge. No scleral icterus.  Neck: Normal range of motion. Neck supple.  Cardiovascular: Normal rate, regular rhythm and  normal heart sounds.   Pulmonary/Chest: Effort normal and breath sounds normal. No respiratory distress. She has no wheezes. She has no rales.  Musculoskeletal: She exhibits no edema.  Lymphadenopathy:    She has no cervical adenopathy.  Neurological: She is alert. She has normal reflexes. No cranial nerve deficit. She exhibits normal muscle tone. Coordination normal.  Skin: Skin is warm and dry. No rash noted.  Psychiatric: She has a normal mood and affect.          Assessment & Plan:   Problem List Items Addressed This  Visit      Respiratory   Sinusitis, acute - Primary    Ongoing uri symptoms with nasal congestion - since before thanksgiving  no resp to zpak tx with augmentin Fluids/ rest/mucinex Trial of flonase for ETD  Update if not starting to improve in a week or if worsening        Relevant Medications      amoxicillin-clavulanate (AUGMENTIN) tablet 875-125 mg      fluticasone (FLONASE) 50 MCG nasal spray

## 2014-07-23 NOTE — Progress Notes (Signed)
Pre visit review using our clinic review tool, if applicable. No additional management support is needed unless otherwise documented below in the visit note. 

## 2014-09-07 ENCOUNTER — Other Ambulatory Visit: Payer: Self-pay | Admitting: Family Medicine

## 2014-09-24 ENCOUNTER — Other Ambulatory Visit: Payer: Self-pay | Admitting: Family Medicine

## 2014-10-02 ENCOUNTER — Ambulatory Visit (INDEPENDENT_AMBULATORY_CARE_PROVIDER_SITE_OTHER): Payer: BLUE CROSS/BLUE SHIELD | Admitting: Family Medicine

## 2014-10-02 ENCOUNTER — Encounter: Payer: Self-pay | Admitting: Family Medicine

## 2014-10-02 VITALS — BP 144/78 | HR 72 | Temp 98.6°F | Ht 62.0 in | Wt 149.5 lb

## 2014-10-02 DIAGNOSIS — H6980 Other specified disorders of Eustachian tube, unspecified ear: Secondary | ICD-10-CM | POA: Insufficient documentation

## 2014-10-02 DIAGNOSIS — H6983 Other specified disorders of Eustachian tube, bilateral: Secondary | ICD-10-CM

## 2014-10-02 NOTE — Progress Notes (Signed)
Subjective:    Patient ID: Jocelyn Payne, female    DOB: 05-Mar-1951, 64 y.o.   MRN: [id number]  HPI Here for uri symptoms   Ears are stopped up -both sides  Started feeling funny last week  Using flonase - 2 sprays each nostril qd and also mucinex  No ear pain  Is aff hearing   Does not tend to have spring allergies   Some cough - sometimes productive of clear mucous Nasal cong in am - clear as well  No fever  No wheezing  She was exp to grandson with the flu   Patient Active Problem List   Diagnosis Date Noted  . Sinusitis, acute 07/23/2014  . Viral URI with cough 06/14/2014  . Plant dermatitis 03/27/2014  . Routine general medical examination at a health care facility 06/08/2011  . HLD (hyperlipidemia) 12/16/2010  . HYPOTHYROIDISM 10/07/2010  . ALLERGIC RHINITIS CAUSE UNSPECIFIED 08/12/2010  . HYPERTENSION, ESSENTIAL 08/12/2010   Past Medical History  Diagnosis Date  . Allergic rhinitis, cause unspecified   . Conversion disorder   . HTN (hypertension)   . Hypothyroid   . OA (osteoarthritis)     left wrist from fracture   . HLD (hyperlipidemia)     high LDL   Past Surgical History  Procedure Laterality Date  . Wrist surgery  02/2008    right fracture   History  Substance Use Topics  . Smoking status: Former Smoker    Quit date: 10/24/2005  . Smokeless tobacco: Never Used  . Alcohol Use: 0.0 oz/week    0 Standard drinks or equivalent per week     Comment: 6 beers 1 night/weekly   Family History  Problem Relation Age of Onset  . Hypertension Mother   . Cancer Father     Lung and esophageal  . Hypertension Brother   . Hypertension Sister   . Hypothyroidism Sister    No Known Allergies Current Outpatient Prescriptions on File Prior to Visit  Medication Sig Dispense Refill  . amLODipine (NORVASC) 2.5 MG tablet TAKE 1 TABLET (2.5 MG TOTAL) BY MOUTH DAILY. 90 tablet 1  . fluticasone (FLONASE) 50 MCG/ACT nasal spray Place 2 sprays into both nostrils  daily. 16 g 6  . levothyroxine (SYNTHROID, LEVOTHROID) 75 MCG tablet TAKE 1 TABLET (75 MCG TOTAL) BY MOUTH DAILY. 90 tablet 0   No current facility-administered medications on file prior to visit.        Review of Systems Review of Systems  Constitutional: Negative for fever, appetite change, fatigue and unexpected weight change.  ENT neg for ear pain or drainage, neg for ST Eyes: Negative for pain and visual disturbance.  Respiratory: Negative for cough and shortness of breath.   Cardiovascular: Negative for cp or palpitations    Gastrointestinal: Negative for nausea, diarrhea and constipation.  Genitourinary: Negative for urgency and frequency.  Skin: Negative for pallor or rash   Neurological: Negative for weakness, light-headedness, numbness and headaches.  Hematological: Negative for adenopathy. Does not bruise/bleed easily.  Psychiatric/Behavioral: Negative for dysphoric mood. The patient is not nervous/anxious.         Objective:   Physical Exam  Constitutional: She appears well-developed and well-nourished. No distress.  overwt and well app  HENT:  Head: Normocephalic and atraumatic.  Mouth/Throat: Oropharynx is clear and moist. No oropharyngeal exudate.  Nares are boggy/scant clear rhinorrhea  No sinus tenderness TM L is dull with small eff TM R clear Scant cerumen on R only  Eyes:  Conjunctivae and EOM are normal. Pupils are equal, round, and reactive to light. Right eye exhibits no discharge. Left eye exhibits no discharge. No scleral icterus.  Neck: Normal range of motion. Neck supple.  Cardiovascular: Normal rate and regular rhythm.   Pulmonary/Chest: Effort normal and breath sounds normal.  Lymphadenopathy:    She has no cervical adenopathy.  Neurological: She is alert. No cranial nerve deficit.  Skin: Skin is warm and dry. No rash noted.  Psychiatric: She has a normal mood and affect.          Assessment & Plan:   Problem List Items Addressed This  Visit      Nervous and Auditory   ETD (eustachian tube dysfunction) - Primary    This has been intermittent  Suspect allergy related -but will update if fever or sinus pain  Will continue flonase  Will try 2 days of afrin to help relieve pressure quicker-but stop after that to avoid rebound Will also try zyrtec  Update if not starting to improve in a week or if worsening

## 2014-10-02 NOTE — Progress Notes (Signed)
Pre visit review using our clinic review tool, if applicable. No additional management support is needed unless otherwise documented below in the visit note. 

## 2014-10-02 NOTE — Assessment & Plan Note (Signed)
This has been intermittent  Suspect allergy related -but will update if fever or sinus pain  Will continue flonase  Will try 2 days of afrin to help relieve pressure quicker-but stop after that to avoid rebound Will also try zyrtec  Update if not starting to improve in a week or if worsening

## 2014-10-02 NOTE — Patient Instructions (Signed)
Continue flonase  Try zyrtec over the counter daily for runny nose -as needed  Get some 12 hour afrin nasal spray - use it every 12 hours for just 2 days and then stop it - this should open you up  Keep up with flonase   You can develop allergies Dennis Bast as you get older   Update if not starting to improve in a week or if worsening

## 2014-11-14 ENCOUNTER — Ambulatory Visit (INDEPENDENT_AMBULATORY_CARE_PROVIDER_SITE_OTHER): Payer: BLUE CROSS/BLUE SHIELD | Admitting: Primary Care

## 2014-11-14 ENCOUNTER — Encounter: Payer: Self-pay | Admitting: Primary Care

## 2014-11-14 VITALS — BP 148/86 | HR 62 | Temp 97.6°F | Ht 64.0 in | Wt 154.0 lb

## 2014-11-14 DIAGNOSIS — H6121 Impacted cerumen, right ear: Secondary | ICD-10-CM

## 2014-11-14 NOTE — Progress Notes (Signed)
Pre visit review using our clinic review tool, if applicable. No additional management support is needed unless otherwise documented below in the visit note. 

## 2014-11-14 NOTE — Patient Instructions (Addendum)
We irrigated the ear wax out of your right ear.  Try using Debrox drops over the counter or the Debrox Wax Removal Kit for future impaction. Also, try taking a daily Claritin for a few weeks to help with allergy symptoms. Continue using Flonase as needed.   Cerumen Impaction A cerumen impaction is when the wax in your ear forms a plug. This plug usually causes reduced hearing. Sometimes it also causes an earache or dizziness. Removing a cerumen impaction can be difficult and painful. The wax sticks to the ear canal. The canal is sensitive and bleeds easily. If you try to remove a heavy wax buildup with a cotton tipped swab, you may push it in further. Irrigation with water, suction, and small ear curettes may be used to clear out the wax. If the impaction is fixed to the skin in the ear canal, ear drops may be needed for a few days to loosen the wax. People who build up a lot of wax frequently can use ear wax removal products available in your local drugstore. SEEK MEDICAL CARE IF:  You develop an earache, increased hearing loss, or marked dizziness. Document Released: 08/19/2004 Document Revised: 10/04/2011 Document Reviewed: 10/09/2009 [facility] Patient Information 2015 [place], [place]. This information is not intended to replace advice given to you by your health care provider. Make sure you discuss any questions you have with your health care provider.

## 2014-11-14 NOTE — Progress Notes (Signed)
Subjective:    Patient ID: Jocelyn Payne, female    DOB: October 29, 1950, 64 y.o.   MRN: [id number]  HPI  [name] is a 64 year old female who presents today with a chief complaint of ear fullness to the right ear that has been present consistently since the beginning of April. She reports a history of ear wax buildup in the past which has required irrigation. She's tried taking Zyrtec at night in the beginning of April which makes her feel drowsy throughout the following day. She denies pain, fevers, chills, sore throat, sinus pressure. She does report a productive cough in the morning upon waking which dissipates after rising.  Review of Systems  Constitutional: Negative for fever and chills.  HENT: Negative for congestion, ear pain, rhinorrhea, sinus pressure and sore throat.        Occasional rhinorrhea and postnasal drip.  Eyes: Negative for itching.  Respiratory: Positive for cough. Negative for shortness of breath.   Cardiovascular: Negative for chest pain.  Gastrointestinal: Negative for nausea and vomiting.  Musculoskeletal: Negative for myalgias.      Past Medical History  Diagnosis Date  . Allergic rhinitis, cause unspecified   . Conversion disorder   . HTN (hypertension)   . Hypothyroid   . OA (osteoarthritis)     left wrist from fracture   . HLD (hyperlipidemia)     high LDL    History   Social History  . Marital Status: Married    Spouse Name: N/A  . Number of Children: N/A  . Years of Education: N/A   Occupational History  . Not on file.   Social History Main Topics  . Smoking status: Former Smoker    Quit date: 10/24/2005  . Smokeless tobacco: Never Used  . Alcohol Use: 0.0 oz/week    0 Standard drinks or equivalent per week     Comment: 6 beers 1 night/weekly  . Drug Use: No  . Sexual Activity: Not on file   Other Topics Concern  . Not on file   Social History Narrative    Past Surgical History  Procedure Laterality Date  . Wrist surgery   02/2008    right fracture    Family History  Problem Relation Age of Onset  . Hypertension Mother   . Cancer Father     Lung and esophageal  . Hypertension Brother   . Hypertension Sister   . Hypothyroidism Sister     No Known Allergies  Current Outpatient Prescriptions on File Prior to Visit  Medication Sig Dispense Refill  . amLODipine (NORVASC) 2.5 MG tablet TAKE 1 TABLET (2.5 MG TOTAL) BY MOUTH DAILY. 90 tablet 1  . levothyroxine (SYNTHROID, LEVOTHROID) 75 MCG tablet TAKE 1 TABLET (75 MCG TOTAL) BY MOUTH DAILY. 90 tablet 0   No current facility-administered medications on file prior to visit.    BP 148/86 mmHg  Pulse 62  Temp(Src) 97.6 F (36.4 C) (Oral)  Ht 5\' 4"  (1.626 m)  Wt 154 lb (69.854 kg)  BMI 26.42 kg/m2  SpO2 97%    Objective:   Physical Exam  Constitutional: She is oriented to person, place, and time. She appears well-developed.  Does not appear sickly.  HENT:  Right Ear: Tympanic membrane normal.  Left Ear: Tympanic membrane and ear canal normal.  Moderate cerumen impaction present to the right ear. Irrigation and use of metal ear curette used to right ear. Patient experienced instant relief after removal. Irritation to right canal present  post irrigation.   Eyes: Conjunctivae are normal. Pupils are equal, round, and reactive to light.  Neck: Neck supple.  Cardiovascular: Normal rate and regular rhythm.   Pulmonary/Chest: Effort normal and breath sounds normal.  Lymphadenopathy:    She has no cervical adenopathy.  Neurological: She is alert and oriented to person, place, and time.  Skin: Skin is warm and dry.          Assessment & Plan:  Cerumen impaction:  Present to right ear. Irrigation completed by CMA and metal curette used by myself to disimpact.  Instant relief after removal. TM NL bilaterally. Irritation to right canal post irrigation, instruction provided to keep clean and no q-tip insertion. Debrox drops to help with subsequent  impaction. Follow up as needed.

## 2014-12-25 ENCOUNTER — Other Ambulatory Visit: Payer: Self-pay | Admitting: Family Medicine

## 2014-12-25 NOTE — Telephone Encounter (Signed)
Pt returned your call, please call back at [phone], thanks

## 2014-12-25 NOTE — Telephone Encounter (Signed)
Left voicemail requesting pt to call office 

## 2014-12-25 NOTE — Telephone Encounter (Signed)
Left voicemail requesting pt to call office back 

## 2014-12-25 NOTE — Telephone Encounter (Signed)
Electronic refill request, pt has had a few acute appt. but no recent or future f/u or CPE, please advise

## 2014-12-25 NOTE — Telephone Encounter (Signed)
Please schedule f/u this summer and refill until then  

## 2014-12-26 NOTE — Telephone Encounter (Signed)
#  [phone] sent to Pharmacy.  Patient aware.

## 2014-12-26 NOTE — Telephone Encounter (Signed)
Patient returned Jocelyn Payne's call.  I scheduled her follow up with Dr.Tower on 01/10/15.

## 2015-01-10 ENCOUNTER — Ambulatory Visit (INDEPENDENT_AMBULATORY_CARE_PROVIDER_SITE_OTHER): Payer: BLUE CROSS/BLUE SHIELD | Admitting: Family Medicine

## 2015-01-10 ENCOUNTER — Encounter: Payer: Self-pay | Admitting: Family Medicine

## 2015-01-10 VITALS — BP 139/80 | HR 68 | Temp 98.3°F | Ht 64.0 in | Wt 144.0 lb

## 2015-01-10 DIAGNOSIS — E785 Hyperlipidemia, unspecified: Secondary | ICD-10-CM

## 2015-01-10 DIAGNOSIS — I10 Essential (primary) hypertension: Secondary | ICD-10-CM

## 2015-01-10 DIAGNOSIS — E039 Hypothyroidism, unspecified: Secondary | ICD-10-CM | POA: Diagnosis not present

## 2015-01-10 LAB — COMPREHENSIVE METABOLIC PANEL
ALBUMIN: 4.5 g/dL (ref 3.5–5.2)
ALT: 18 U/L (ref 0–35)
AST: 22 U/L (ref 0–37)
Alkaline Phosphatase: 104 U/L (ref 39–117)
BUN: 10 mg/dL (ref 6–23)
CALCIUM: 10.1 mg/dL (ref 8.4–10.5)
CHLORIDE: 102 meq/L (ref 96–112)
CO2: 26 meq/L (ref 19–32)
Creatinine, Ser: 0.73 mg/dL (ref 0.40–1.20)
GFR: 85.2 mL/min (ref >60.00)
Glucose, Bld: 101 mg/dL — ABNORMAL HIGH (ref 70–99)
POTASSIUM: 4.2 meq/L (ref 3.5–5.1)
Sodium: 135 mEq/L (ref 135–145)
Total Bilirubin: 0.6 mg/dL (ref 0.2–1.2)
Total Protein: 7.9 g/dL (ref 6.0–8.3)

## 2015-01-10 LAB — CBC WITH DIFFERENTIAL/PLATELET
Basophils Absolute: 0 10*3/uL (ref 0.0–0.1)
Basophils Relative: 0.4 % (ref 0.0–3.0)
EOS ABS: 0.1 10*3/uL (ref 0.0–0.7)
Eosinophils Relative: 1.6 % (ref 0.0–5.0)
HCT: 43.5 % (ref 36.0–46.0)
Hemoglobin: 14.7 g/dL (ref 12.0–15.0)
LYMPHS PCT: 18.5 % (ref 12.0–46.0)
Lymphs Abs: 1.5 10*3/uL (ref 0.7–4.0)
MCHC: 33.7 g/dL (ref 30.0–36.0)
MCV: 95.4 fl (ref 78.0–100.0)
Monocytes Absolute: 0.6 10*3/uL (ref 0.1–1.0)
Monocytes Relative: 7.3 % (ref 3.0–12.0)
NEUTROS PCT: 72.2 % (ref 43.0–77.0)
Neutro Abs: 6 10*3/uL (ref 1.4–7.7)
PLATELETS: 422 10*3/uL — AB (ref 150.0–400.0)
RBC: 4.56 Mil/uL (ref 3.87–5.11)
RDW: 14.1 % (ref 11.5–15.5)
WBC: 8.3 10*3/uL (ref 4.0–10.5)

## 2015-01-10 LAB — LIPID PANEL
CHOL/HDL RATIO: 3
Cholesterol: 205 mg/dL — ABNORMAL HIGH (ref 0–200)
HDL: 67.5 mg/dL (ref >39.00)
LDL CALC: 117 mg/dL — AB (ref 0–99)
NonHDL: 137.5
Triglycerides: 103 mg/dL (ref 0.0–149.0)
VLDL: 20.6 mg/dL (ref 0.0–40.0)

## 2015-01-10 LAB — TSH: TSH: 2.92 u[IU]/mL (ref 0.35–4.50)

## 2015-01-10 MED ORDER — LEVOTHYROXINE SODIUM 75 MCG PO TABS: ORAL_TABLET | ORAL | Status: DC

## 2015-01-10 MED ORDER — AMLODIPINE BESYLATE 2.5 MG PO TABS: 2.5000 mg | ORAL_TABLET | Freq: Every day | ORAL | Status: DC

## 2015-01-10 NOTE — Assessment & Plan Note (Signed)
No clinical changes Nl exam tsh lab today

## 2015-01-10 NOTE — Assessment & Plan Note (Signed)
bp in fair control at this time (some whitecoat component) BP Readings from Last 1 Encounters:  01/10/15 139/80   No changes needed Disc lifstyle change with low sodium diet and exercise  Lab today

## 2015-01-10 NOTE — Progress Notes (Signed)
Pre visit review using our clinic review tool, if applicable. No additional management support is needed unless otherwise documented below in the visit note. 

## 2015-01-10 NOTE — Progress Notes (Signed)
Subjective:    Patient ID: Jocelyn Payne, female    DOB: March 17, 1951, 64 y.o.   MRN: [id number]  HPI Here for f/u of chronic health problems   Wt is down 10 lb with bmi of 24 Worked hard on it  Walks 2.4 mi per day with a stroller (since end of May)- does it early in the am   bp is stable today  No cp or palpitations or headaches or edema  No side effects to medicines  BP Readings from Last 3 Encounters:  01/10/15 146/82  11/14/14 148/86  10/02/14 144/78    Was prev on ace and hct = "messed with my kidneys" On 2.5 of norvasc -no side effects  Has whitecoat HTN- bp at home avg 130/70 consistently  BP: 139/80 mmHg -improved on re check     Cholesterol Diet controlled Due for a check Lab Results  Component Value Date   CHOL 223* 12/12/2013   HDL 63.20 12/12/2013   LDLCALC 122* 12/12/2013   LDLDIRECT 137.4 01/03/2013   TRIG 188.0* 12/12/2013   CHOLHDL 4 12/12/2013  eating smart  Eating less sat fat   Hypothyroid Due for tsh check No fatigue or hair or skin changes No missed doses in levothyroxine   Patient Active Problem List   Diagnosis Date Noted  . ETD (eustachian tube dysfunction) 10/02/2014  . Sinusitis, acute 07/23/2014  . Viral URI with cough 06/14/2014  . Plant dermatitis 03/27/2014  . Routine general medical examination at a health care facility 06/08/2011  . HLD (hyperlipidemia) 12/16/2010  . HYPOTHYROIDISM 10/07/2010  . ALLERGIC RHINITIS CAUSE UNSPECIFIED 08/12/2010  . HYPERTENSION, ESSENTIAL 08/12/2010   Past Medical History  Diagnosis Date  . Allergic rhinitis, cause unspecified   . Conversion disorder   . HTN (hypertension)   . Hypothyroid   . OA (osteoarthritis)     left wrist from fracture   . HLD (hyperlipidemia)     high LDL   Past Surgical History  Procedure Laterality Date  . Wrist surgery  02/2008    right fracture   History  Substance Use Topics  . Smoking status: Former Smoker    Quit date: 10/24/2005  . Smokeless  tobacco: Never Used  . Alcohol Use: 0.0 oz/week    0 Standard drinks or equivalent per week     Comment: 6 beers 1 night/weekly   Family History  Problem Relation Age of Onset  . Hypertension Mother   . Cancer Father     Lung and esophageal  . Hypertension Brother   . Hypertension Sister   . Hypothyroidism Sister    No Known Allergies Current Outpatient Prescriptions on File Prior to Visit  Medication Sig Dispense Refill  . amLODipine (NORVASC) 2.5 MG tablet TAKE 1 TABLET (2.5 MG TOTAL) BY MOUTH DAILY. 90 tablet 1  . levothyroxine (SYNTHROID, LEVOTHROID) 75 MCG tablet TAKE 1 TABLET (75 MCG TOTAL) BY MOUTH DAILY. 30 tablet 0   No current facility-administered medications on file prior to visit.         Review of Systems Review of Systems  Constitutional: Negative for fever, appetite change, fatigue and unexpected weight change.  Eyes: Negative for pain and visual disturbance.  Respiratory: Negative for cough and shortness of breath.   Cardiovascular: Negative for cp or palpitations    Gastrointestinal: Negative for nausea, diarrhea and constipation.  Genitourinary: Negative for urgency and frequency.  Skin: Negative for pallor or rash   Neurological: Negative for weakness, light-headedness, numbness and headaches.  Hematological: Negative for adenopathy. Does not bruise/bleed easily.  Psychiatric/Behavioral: Negative for dysphoric mood. The patient is not nervous/anxious.         Objective:   Physical Exam  Constitutional: She appears well-developed and well-nourished. No distress.  HENT:  Head: Normocephalic and atraumatic.  Mouth/Throat: Oropharynx is clear and moist.  Eyes: Conjunctivae and EOM are normal. Pupils are equal, round, and reactive to light.  Neck: Normal range of motion. Neck supple. No JVD present. Carotid bruit is not present. No thyromegaly present.  Cardiovascular: Normal rate, regular rhythm, normal heart sounds and intact distal pulses.  Exam  reveals no gallop.   Pulmonary/Chest: Effort normal and breath sounds normal. No respiratory distress. She has no wheezes. She has no rales.  No crackles  Abdominal: Soft. Bowel sounds are normal. She exhibits no distension, no abdominal bruit and no mass. There is no tenderness.  Musculoskeletal: She exhibits no edema.  Lymphadenopathy:    She has no cervical adenopathy.  Neurological: She is alert. She has normal reflexes.  Skin: Skin is warm and dry. No rash noted.  Psychiatric: She has a normal mood and affect.          Assessment & Plan:   Problem List Items Addressed This Visit    Essential hypertension - Primary    bp in fair control at this time (some whitecoat component) BP Readings from Last 1 Encounters:  01/10/15 139/80   No changes needed Disc lifstyle change with low sodium diet and exercise  Lab today       Relevant Medications   amLODipine (NORVASC) 2.5 MG tablet   Other Relevant Orders   CBC with Differential/Platelet (Completed)   Comprehensive metabolic panel (Completed)   TSH (Completed)   Lipid panel (Completed)   HLD (hyperlipidemia)    Disc goals for lipids and reasons to control them Rev labs with pt (last time)- check lipid panel today Rev low sat fat diet in detail       Relevant Medications   amLODipine (NORVASC) 2.5 MG tablet   Other Relevant Orders   Lipid panel (Completed)   Hypothyroidism    No clinical changes Nl exam tsh lab today        Relevant Medications   levothyroxine (SYNTHROID, LEVOTHROID) 75 MCG tablet   Other Relevant Orders   TSH (Completed)

## 2015-01-10 NOTE — Assessment & Plan Note (Signed)
Disc goals for lipids and reasons to control them Rev labs with pt (last time)- check lipid panel today Rev low sat fat diet in detail

## 2015-01-10 NOTE — Patient Instructions (Signed)
Don't forget to make your mammogram appointment  Blood pressure is stable - the next time you come-bring your cuff so we know it is accurate Keep up the exercise and healthy diet  Lab today

## 2015-01-13 ENCOUNTER — Encounter: Payer: Self-pay | Admitting: *Deleted

## 2015-01-27 ENCOUNTER — Other Ambulatory Visit: Payer: Self-pay | Admitting: Family Medicine

## 2015-02-03 ENCOUNTER — Other Ambulatory Visit: Payer: Self-pay | Admitting: Family Medicine

## 2015-02-13 ENCOUNTER — Telehealth: Payer: Self-pay

## 2015-02-13 NOTE — Telephone Encounter (Signed)
Left a voicemail for patient notifying her that she is due for a mammogram. Will await call back.

## 2015-02-19 MED ORDER — LEVOTHYROXINE SODIUM 75 MCG PO TABS: 75.0000 ug | ORAL_TABLET | Freq: Every day | ORAL | Status: DC

## 2015-02-19 NOTE — Addendum Note (Signed)
Addended by: Tammi Sou on: 02/19/2015 09:42 AM   Modules accepted: Orders

## 2015-02-19 NOTE — Telephone Encounter (Signed)
Pt request 90 day supply instead of 30, done

## 2015-03-24 ENCOUNTER — Other Ambulatory Visit: Payer: Self-pay | Admitting: Family Medicine

## 2015-04-24 ENCOUNTER — Ambulatory Visit (INDEPENDENT_AMBULATORY_CARE_PROVIDER_SITE_OTHER): Payer: BLUE CROSS/BLUE SHIELD | Admitting: Internal Medicine

## 2015-04-24 ENCOUNTER — Encounter: Payer: Self-pay | Admitting: Internal Medicine

## 2015-04-24 VITALS — BP 150/90 | HR 78 | Temp 98.5°F | Wt 140.0 lb

## 2015-04-24 DIAGNOSIS — J0100 Acute maxillary sinusitis, unspecified: Secondary | ICD-10-CM

## 2015-04-24 MED ORDER — AMOXICILLIN 500 MG PO CAPS: 500.0000 mg | ORAL_CAPSULE | Freq: Three times a day (TID) | ORAL | Status: DC

## 2015-04-24 NOTE — Progress Notes (Signed)
HPI  Pt presents to the clinic today with c/o headache, facial pain and pressure and nasal congestion. This started 1 week ago. She is blowing green mucous out of her nose. She has run low grade fevers up to 99.0. She has had intermittent chills and body aches. She does have a history of seasonal allergies but denies breathing problems. She has not had sick contacts. She does not smoke.   Review of Systems    Past Medical History  Diagnosis Date  . Allergic rhinitis, cause unspecified   . Conversion disorder   . HTN (hypertension)   . Hypothyroid   . OA (osteoarthritis)     left wrist from fracture   . HLD (hyperlipidemia)     high LDL    Family History  Problem Relation Age of Onset  . Hypertension Mother   . Cancer Father     Lung and esophageal  . Hypertension Brother   . Hypertension Sister   . Hypothyroidism Sister     Social History   Social History  . Marital Status: Married    Spouse Name: N/A  . Number of Children: N/A  . Years of Education: N/A   Occupational History  . Not on file.   Social History Main Topics  . Smoking status: Former Smoker    Quit date: 10/24/2005  . Smokeless tobacco: Never Used  . Alcohol Use: 0.0 oz/week    0 Standard drinks or equivalent per week     Comment: 6 beers 1 night/weekly  . Drug Use: No  . Sexual Activity: Not on file   Other Topics Concern  . Not on file   Social History Narrative    No Known Allergies   Constitutional: Positive headache, fatigue and fever. Denies abrupt weight changes.  HEENT:  Positive facial pain, nasal congestion and sore throat. Denies eye redness, ear pain, ringing in the ears, wax buildup, runny nose or bloody nose. Respiratory: Denies cough, difficulty breathing or shortness of breath.  Cardiovascular: Denies chest pain, chest tightness, palpitations or swelling in the hands or feet.   No other specific complaints in a complete review of systems (except as listed in HPI  above).  Objective:  BP 150/90 mmHg  Pulse 78  Temp(Src) 98.5 F (36.9 C) (Oral)  Wt 140 lb (63.504 kg)  SpO2 99%   General: Appears her stated age, well developed, well nourished in NAD. HEENT: Head: normal shape and size, frontal and maxillary sinus tenderness noted; Eyes: sclera white, no icterus, conjunctiva pink; Ears: Tm's gray and intact, normal light reflex; Nose: mucosa boggy and moist, septum midline; Throat/Mouth: + PND. Teeth present, mucosa erythematous and moist, no exudate noted, no lesions or ulcerations noted.  Neck:  Cervical adenopathy noted. Cardiovascular: Normal rate and rhythm. S1,S2 noted.  No murmur, rubs or gallops noted.  Pulmonary/Chest: Normal effort and positive vesicular breath sounds. No respiratory distress. No wheezes, rales or ronchi noted.      Assessment & Plan:   Acute bacterial sinusitis  Can use a Neti Pot which can be purchased from your local drug store. Flonase 2 sprays each nostril for 3 days and then as needed. Amoxil TID for 10 days  RTC as needed or if symptoms persist.

## 2015-04-24 NOTE — Patient Instructions (Signed)

## 2015-04-24 NOTE — Progress Notes (Signed)
   Subjective:    Patient ID: Jocelyn Payne, female    DOB: [birth year], 64 y.o.   MRN: [id number]  HPI Jocelyn Payne is a 64 year old female who presents today wth chief complaint of sinus pressure and pain, runny nose with green mucous, and temp of 99.  Symptoms began 7 days ago and have not gotten any better. She has not used anything OTC.  She denies sick contacts.    Review of Systems  Constitutional: Positive for fever, chills and fatigue.  HENT: Positive for congestion, postnasal drip, rhinorrhea and sinus pressure. Negative for sore throat.   Respiratory: Negative for cough, shortness of breath and wheezing.   Cardiovascular: Negative for chest pain, palpitations and leg swelling.  Gastrointestinal: Negative for abdominal pain, diarrhea and constipation.  Musculoskeletal: Negative for myalgias, joint swelling and arthralgias.  Neurological: Negative for dizziness and headaches.   Family History  Problem Relation Age of Onset  . Hypertension Mother   . Cancer Father     Lung and esophageal  . Hypertension Brother   . Hypertension Sister   . Hypothyroidism Sister    Current Outpatient Prescriptions on File Prior to Visit  Medication Sig Dispense Refill  . amLODipine (NORVASC) 2.5 MG tablet Take 1 tablet (2.5 mg total) by mouth daily. 90 tablet 3  . levothyroxine (SYNTHROID, LEVOTHROID) 75 MCG tablet Take 1 tablet (75 mcg total) by mouth daily. 90 tablet 1   No current facility-administered medications on file prior to visit.        Objective:   Physical Exam  Constitutional: She is oriented to person, place, and time. She appears well-developed and well-nourished. No distress.  HENT:  Head: Normocephalic and atraumatic.  Right Ear: External ear normal.  Left Ear: External ear normal.  Mouth/Throat: Oropharynx is clear and moist.  Frontal and maxillary sinus tenderness with palpation.  Eyes: Conjunctivae are normal. Pupils are equal, round, and reactive to light.  Neck:  Normal range of motion. Neck supple.  Cardiovascular: Normal rate, regular rhythm and normal heart sounds.   Pulmonary/Chest: Effort normal and breath sounds normal.  Abdominal: Soft.  Musculoskeletal: Normal range of motion.  Neurological: She is alert and oriented to person, place, and time.  Skin: Skin is warm and dry. She is not diaphoretic.  Psychiatric: She has a normal mood and affect.  BP 150/90 mmHg  Pulse 78  Temp(Src) 98.5 F (36.9 C) (Oral)  Wt 140 lb (63.504 kg)  SpO2 99%         Assessment & Plan:  1. Sinusitis Amoxicillin 500mg  po tid for 10 days.  Patient would prefer this over Augmentin.  Advised patient to use flonase twice daily as well. Contact office if not feeling better.

## 2015-04-24 NOTE — Progress Notes (Signed)
Pre visit review using our clinic review tool, if applicable. No additional management support is needed unless otherwise documented below in the visit note. 

## 2015-06-10 ENCOUNTER — Encounter: Payer: Self-pay | Admitting: Family Medicine

## 2015-07-09 ENCOUNTER — Encounter: Payer: Self-pay | Admitting: Primary Care

## 2015-07-09 ENCOUNTER — Ambulatory Visit (INDEPENDENT_AMBULATORY_CARE_PROVIDER_SITE_OTHER): Payer: BLUE CROSS/BLUE SHIELD | Admitting: Primary Care

## 2015-07-09 VITALS — BP 148/82 | HR 88 | Temp 99.1°F | Wt 139.2 lb

## 2015-07-09 DIAGNOSIS — J019 Acute sinusitis, unspecified: Secondary | ICD-10-CM | POA: Diagnosis not present

## 2015-07-09 MED ORDER — AMOXICILLIN 500 MG PO CAPS: 500.0000 mg | ORAL_CAPSULE | Freq: Three times a day (TID) | ORAL | Status: DC

## 2015-07-09 NOTE — Assessment & Plan Note (Signed)
URI symptoms with moderate to severe facial pain upon exam. Last treated for sinusitis was late September.  Low grade fevers on tylenol since Monday which is concerning. Printed Rx for amoxicillin for her to fill Saturday this week if no improvement. Cannot tolerate Augmentin, prefers amoxicillin.  Discussed complications of frequent antibiotic use and to discuss recurrent sinus infections with PCP as she may need further evaluation.  Flonase, robitussin, neti pot rinses, switch to ibuprofen for fevers now. She is to notify us if no improvement on antibiotics.

## 2015-07-09 NOTE — Progress Notes (Signed)
Pre visit review using our clinic review tool, if applicable. No additional management support is needed unless otherwise documented below in the visit note. 

## 2015-07-09 NOTE — Patient Instructions (Signed)
Fill the amoxicillin prescription Saturday this weekend if no improvement in facial pain or symptoms. You will take 1 tablet by mouth three times daily x 10 days.  Cough: Robitussin DM  Fevers and body aches: Ibuprofen 600 mg three times daily.  Increase consumption of fluids and rest.  Please call us if no improvement after consumption of antibiotics.  It was a pleasure meeting you!

## 2015-07-09 NOTE — Progress Notes (Signed)
Subjective:    Patient ID: Jocelyn Payne, female    DOB: July 01, 1951, 64 y.o.   MRN: [id number]  HPI  Jocelyn Payne is a 64 year old female who presents today with a chief complaint of cough. She also reports facial pain, sinus pressure, fever, nasal congestion. Her symptoms have been present since Sunday evening this week.   Her cough is slightly productive, but she's been swallowing her mucous. She's blowing green/white mucous from her nose. Denies sick contacts. She was last evaluated in late September and treated for acute bacterial sinusitis.   She's taken tylenol for fevers twice daily since Monday evening. Low grade fever today in clinic, last dose of tylenol was at 3am today. Since Sunday her symptoms have not improved, but are no worse.   Review of Systems  Constitutional: Positive for fever and chills.  HENT: Positive for congestion, rhinorrhea and sinus pressure. Negative for ear pain and sore throat.   Respiratory: Positive for cough. Negative for shortness of breath.   Cardiovascular: Negative for chest pain.  Musculoskeletal: Positive for myalgias.  Neurological: Positive for headaches.       Past Medical History  Diagnosis Date  . Allergic rhinitis, cause unspecified   . Conversion disorder   . HTN (hypertension)   . Hypothyroid   . OA (osteoarthritis)     left wrist from fracture   . HLD (hyperlipidemia)     high LDL    Social History   Social History  . Marital Status: Married    Spouse Name: N/A  . Number of Children: N/A  . Years of Education: N/A   Occupational History  . Not on file.   Social History Main Topics  . Smoking status: Former Smoker    Quit date: 10/24/2005  . Smokeless tobacco: Never Used  . Alcohol Use: 0.0 oz/week    0 Standard drinks or equivalent per week     Comment: 6 beers 1 night/weekly  . Drug Use: No  . Sexual Activity: Not on file   Other Topics Concern  . Not on file   Social History Narrative    Past Surgical  History  Procedure Laterality Date  . Wrist surgery  02/2008    right fracture    Family History  Problem Relation Age of Onset  . Hypertension Mother   . Cancer Father     Lung and esophageal  . Hypertension Brother   . Hypertension Sister   . Hypothyroidism Sister     No Known Allergies  Current Outpatient Prescriptions on File Prior to Visit  Medication Sig Dispense Refill  . amLODipine (NORVASC) 2.5 MG tablet Take 1 tablet (2.5 mg total) by mouth daily. 90 tablet 3  . levothyroxine (SYNTHROID, LEVOTHROID) 75 MCG tablet Take 1 tablet (75 mcg total) by mouth daily. 90 tablet 1   No current facility-administered medications on file prior to visit.    BP 148/82 mmHg  Pulse 88  Temp(Src) 99.1 F (37.3 C) (Oral)  Wt 139 lb 4 oz (63.163 kg)  SpO2 97%    Objective:   Physical Exam  Constitutional: She appears well-nourished.  HENT:  Right Ear: Tympanic membrane and ear canal normal.  Left Ear: Tympanic membrane and ear canal normal.  Nose: Right sinus exhibits maxillary sinus tenderness and frontal sinus tenderness. Left sinus exhibits maxillary sinus tenderness and frontal sinus tenderness.  Mouth/Throat: Oropharynx is clear and moist.  Moderate-severe tenderness to maxillary sinuses  Eyes: Conjunctivae are normal. Pupils are  equal, round, and reactive to light.  Neck: Neck supple.  Cardiovascular: Normal rate and regular rhythm.   Pulmonary/Chest: Effort normal and breath sounds normal. She has no rales.  Lymphadenopathy:    She has no cervical adenopathy.  Skin: Skin is warm and dry.          Assessment & Plan:

## 2015-10-02 ENCOUNTER — Other Ambulatory Visit: Payer: Self-pay | Admitting: Family Medicine

## 2015-10-07 ENCOUNTER — Telehealth: Payer: Self-pay

## 2015-10-07 NOTE — Telephone Encounter (Signed)
Pt left /vm requesting amlodipine and levothyroxine transferred to [facility] in [place]. Left v/m requesting pt to cb.

## 2015-10-08 NOTE — Telephone Encounter (Signed)
Pt called back and advised to have new pharmacy contact the previous pharmacy and refills can be transferred. Pt voiced understanding.

## 2015-10-08 NOTE — Telephone Encounter (Signed)
Left voicemail letting [facility] [place] know that it was okay to switch manufactures of pt's levothyroxine

## 2015-10-08 NOTE — Telephone Encounter (Signed)
Jocelyn Payne with [facility] [address] left v/m; pt transferring generic synthroid and [facility] has different brand; pt has been using [employer] brand and [facility] brand is [employer]. Is it OK to switch brands?

## 2015-10-08 NOTE — Telephone Encounter (Signed)
Patient called to check on status of this.  Advised pharmacy has been notified - okay to change manufacturer.

## 2015-10-08 NOTE — Telephone Encounter (Signed)
That is ok with me  

## 2016-01-08 ENCOUNTER — Other Ambulatory Visit: Payer: Self-pay | Admitting: Family Medicine

## 2016-01-08 NOTE — Telephone Encounter (Signed)
Pt is out of her thyroid medication.  She said she knows she needs a visit before more is filled.  She wants to know if she can please get a 30 day supply to get her though.  She is now on Medicare and she wants to know if she needs to have a physical or what and if she needs bloodwork before getting more medicine.  Please call patient.

## 2016-01-09 MED ORDER — LEVOTHYROXINE SODIUM 75 MCG PO TABS: 75.0000 ug | ORAL_TABLET | Freq: Every day | ORAL | Status: DC

## 2016-01-09 NOTE — Telephone Encounter (Signed)
Please schedule an AMW with Lesia  and then annual exam with labs prior in the fall or late fall and refill until then

## 2016-01-09 NOTE — Telephone Encounter (Signed)
Spoke to pt, she has scheduled new to medicare wellness visit for Sept 8th. Will send in refill of Rx to [facility] [address]. Does pt need to have a lab only appt before wellness or can wait until OV---please advise---lab only appt will have to be scheduled when decided which is preferred

## 2016-01-09 NOTE — Telephone Encounter (Signed)
Pt left v/m requesting cb about thyroid refill to [facility] [address].

## 2016-02-19 DIAGNOSIS — L814 Other melanin hyperpigmentation: Secondary | ICD-10-CM | POA: Diagnosis not present

## 2016-02-19 DIAGNOSIS — L570 Actinic keratosis: Secondary | ICD-10-CM | POA: Diagnosis not present

## 2016-02-19 DIAGNOSIS — L818 Other specified disorders of pigmentation: Secondary | ICD-10-CM | POA: Diagnosis not present

## 2016-02-19 DIAGNOSIS — L821 Other seborrheic keratosis: Secondary | ICD-10-CM | POA: Diagnosis not present

## 2016-02-19 DIAGNOSIS — D1801 Hemangioma of skin and subcutaneous tissue: Secondary | ICD-10-CM | POA: Diagnosis not present

## 2016-02-19 DIAGNOSIS — Z85828 Personal history of other malignant neoplasm of skin: Secondary | ICD-10-CM | POA: Diagnosis not present

## 2016-02-19 DIAGNOSIS — L720 Epidermal cyst: Secondary | ICD-10-CM | POA: Diagnosis not present

## 2016-03-26 ENCOUNTER — Encounter: Payer: Self-pay | Admitting: Family Medicine

## 2016-03-26 ENCOUNTER — Telehealth (INDEPENDENT_AMBULATORY_CARE_PROVIDER_SITE_OTHER): Payer: Medicare Other | Admitting: Family Medicine

## 2016-03-26 ENCOUNTER — Ambulatory Visit (INDEPENDENT_AMBULATORY_CARE_PROVIDER_SITE_OTHER): Payer: Medicare Other | Admitting: Family Medicine

## 2016-03-26 ENCOUNTER — Other Ambulatory Visit (INDEPENDENT_AMBULATORY_CARE_PROVIDER_SITE_OTHER): Payer: Medicare Other

## 2016-03-26 DIAGNOSIS — Z8619 Personal history of other infectious and parasitic diseases: Secondary | ICD-10-CM | POA: Insufficient documentation

## 2016-03-26 DIAGNOSIS — E039 Hypothyroidism, unspecified: Secondary | ICD-10-CM

## 2016-03-26 DIAGNOSIS — B029 Zoster without complications: Secondary | ICD-10-CM | POA: Diagnosis not present

## 2016-03-26 DIAGNOSIS — E785 Hyperlipidemia, unspecified: Secondary | ICD-10-CM | POA: Diagnosis not present

## 2016-03-26 DIAGNOSIS — I10 Essential (primary) hypertension: Secondary | ICD-10-CM | POA: Diagnosis not present

## 2016-03-26 LAB — CBC WITH DIFFERENTIAL/PLATELET
BASOS ABS: 0 10*3/uL (ref 0.0–0.1)
Basophils Relative: 0.3 % (ref 0.0–3.0)
EOS ABS: 0.2 10*3/uL (ref 0.0–0.7)
Eosinophils Relative: 2.2 % (ref 0.0–5.0)
HEMATOCRIT: 45.5 % (ref 36.0–46.0)
Hemoglobin: 15.5 g/dL — ABNORMAL HIGH (ref 12.0–15.0)
LYMPHS ABS: 2 10*3/uL (ref 0.7–4.0)
LYMPHS PCT: 19.5 % (ref 12.0–46.0)
MCHC: 34 g/dL (ref 30.0–36.0)
MCV: 97.8 fl (ref 78.0–100.0)
MONO ABS: 0.8 10*3/uL (ref 0.1–1.0)
Monocytes Relative: 8.1 % (ref 3.0–12.0)
NEUTROS ABS: 7.2 10*3/uL (ref 1.4–7.7)
NEUTROS PCT: 69.9 % (ref 43.0–77.0)
PLATELETS: 454 10*3/uL — AB (ref 150.0–400.0)
RBC: 4.66 Mil/uL (ref 3.87–5.11)
RDW: 14.1 % (ref 11.5–15.5)
WBC: 10.4 10*3/uL (ref 4.0–10.5)

## 2016-03-26 LAB — COMPREHENSIVE METABOLIC PANEL
ALT: 17 U/L (ref 0–35)
AST: 23 U/L (ref 0–37)
Albumin: 4.7 g/dL (ref 3.5–5.2)
Alkaline Phosphatase: 96 U/L (ref 39–117)
BILIRUBIN TOTAL: 0.3 mg/dL (ref 0.2–1.2)
BUN: 14 mg/dL (ref 6–23)
CO2: 27 meq/L (ref 19–32)
CREATININE: 0.79 mg/dL (ref 0.40–1.20)
Calcium: 10.1 mg/dL (ref 8.4–10.5)
Chloride: 102 mEq/L (ref 96–112)
GFR: 77.48 mL/min (ref >60.00)
GLUCOSE: 104 mg/dL — AB (ref 70–99)
Potassium: 4.7 mEq/L (ref 3.5–5.1)
SODIUM: 137 meq/L (ref 135–145)
Total Protein: 8.1 g/dL (ref 6.0–8.3)

## 2016-03-26 LAB — LIPID PANEL
CHOLESTEROL: 222 mg/dL — AB (ref 0–200)
HDL: 68.8 mg/dL (ref >39.00)
LDL CALC: 135 mg/dL — AB (ref 0–99)
NonHDL: 153.07
TRIGLYCERIDES: 91 mg/dL (ref 0.0–149.0)
Total CHOL/HDL Ratio: 3
VLDL: 18.2 mg/dL (ref 0.0–40.0)

## 2016-03-26 LAB — TSH: TSH: 3.31 u[IU]/mL (ref 0.35–4.50)

## 2016-03-26 MED ORDER — IBUPROFEN 200 MG PO TABS: 400.0000 mg | ORAL_TABLET | Freq: Three times a day (TID) | ORAL | Status: DC | PRN

## 2016-03-26 MED ORDER — GABAPENTIN 100 MG PO CAPS: 100.0000 mg | ORAL_CAPSULE | Freq: Three times a day (TID) | ORAL | 0 refills | Status: DC | PRN

## 2016-03-26 MED ORDER — VALACYCLOVIR HCL 1 G PO TABS: 1000.0000 mg | ORAL_TABLET | Freq: Three times a day (TID) | ORAL | 0 refills | Status: DC

## 2016-03-26 NOTE — Assessment & Plan Note (Signed)
Likely dx.  D/w pt.  Start valtrex.  See AVS re: pain.  She agrees.  Nontoxic. Okay for outpatient f/u.  Routine cautions given on meds.

## 2016-03-26 NOTE — Progress Notes (Signed)
Rash on back.  And in both armpits.  Started yesterday AM.  No new detergents, no new neoderant.  No FCNAV.  She doesn't feel sick.  Rash is uncomfortable, stinging.  Itchy.    Meds, vitals, and allergies reviewed.   ROS: Per HPI unless specifically indicated in ROS section   nad Chaperoned exam.  Dermatomal rash with vesicles and change in sensation on the back.  She has B rash but different level dermatomes- higher on the R side.   Vesicles noted on the R arm.

## 2016-03-26 NOTE — Progress Notes (Signed)
Pre visit review using our clinic review tool, if applicable. No additional management support is needed unless otherwise documented below in the visit note. 

## 2016-03-26 NOTE — Telephone Encounter (Signed)
-----   Message from [name] sent at 03/25/2016  4:42 PM EDT ----- Regarding: Lab orders for Friday, 9.1.17 Patient is scheduled for CPX labs, please order future labs, Thanks , Karna Christmas

## 2016-03-26 NOTE — Patient Instructions (Signed)
Start valtrex.  Take buprofen for pain with food.  If still in pain, then start gabapentin, sedation caution.  Take care.  Glad to see you.

## 2016-04-02 ENCOUNTER — Ambulatory Visit (INDEPENDENT_AMBULATORY_CARE_PROVIDER_SITE_OTHER): Payer: Medicare Other | Admitting: Family Medicine

## 2016-04-02 ENCOUNTER — Encounter: Payer: Self-pay | Admitting: Family Medicine

## 2016-04-02 VITALS — BP 128/70 | HR 49 | Temp 98.5°F | Ht 61.25 in | Wt 139.2 lb

## 2016-04-02 DIAGNOSIS — Z0000 Encounter for general adult medical examination without abnormal findings: Secondary | ICD-10-CM

## 2016-04-02 DIAGNOSIS — I10 Essential (primary) hypertension: Secondary | ICD-10-CM

## 2016-04-02 DIAGNOSIS — E039 Hypothyroidism, unspecified: Secondary | ICD-10-CM

## 2016-04-02 DIAGNOSIS — E785 Hyperlipidemia, unspecified: Secondary | ICD-10-CM | POA: Diagnosis not present

## 2016-04-02 MED ORDER — AMLODIPINE BESYLATE 2.5 MG PO TABS: 2.5000 mg | ORAL_TABLET | Freq: Every day | ORAL | 3 refills | Status: DC

## 2016-04-02 MED ORDER — LEVOTHYROXINE SODIUM 75 MCG PO TABS: 75.0000 ug | ORAL_TABLET | Freq: Every day | ORAL | 3 refills | Status: DC

## 2016-04-02 NOTE — Assessment & Plan Note (Signed)
bp in fair control at this time  BP Readings from Last 1 Encounters:  04/02/16 128/70   No changes needed Disc lifstyle change with low sodium diet and exercise  (better on 2nd check today)

## 2016-04-02 NOTE — Assessment & Plan Note (Signed)
LDL is up  Likely from more fried food and shellfish lately  Made a plan to back off some trans and sat fats  Disc goals for lipids and reasons to control them Rev labs with pt Rev low sat fat diet in detail

## 2016-04-02 NOTE — Patient Instructions (Addendum)
We will order the cologuard screening kit for you  Make sure to schedule your mammogram for November -if you don't get a card in the mail - call us for a referral  Try to get 1200-1500 mg of calcium per day with at least 1000 iu of vitamin D - for bone health  Take a look at the blue booklet to work on an advance directive (living will and power of attorney)  Work on better diet for high cholesterol (Avoid red meat/ fried foods/ egg yolks/ fatty breakfast meats/ butter, cheese and high fat dairy/ and shellfish)

## 2016-04-02 NOTE — Assessment & Plan Note (Signed)
Reviewed health habits including diet and exercise and skin cancer prevention Reviewed appropriate screening tests for age  Also reviewed health mt list, fam hx and immunization status , as well as social and family history   See HPI Labs reviewed bp improved on 2nd check We will order the cologuard screening kit for you  Make sure to schedule your mammogram for November -if you don't get a card in the mail - call us for a referral  Try to get 1200-1500 mg of calcium per day with at least 1000 iu of vitamin D - for bone health  Take a look at the blue booklet to work on an advance directive (living will and power of attorney)  Work on better diet for high cholesterol (Avoid red meat/ fried foods/ egg yolks/ fatty breakfast meats/ butter, cheese and high fat dairy/ and shellfish)

## 2016-04-02 NOTE — Assessment & Plan Note (Signed)
Hypothyroidism  Pt has no clinical changes No change in energy level/ hair or skin/ edema and no tremor Lab Results  Component Value Date   TSH 3.31 03/26/2016

## 2016-04-02 NOTE — Progress Notes (Signed)
Pre visit review using our clinic review tool, if applicable. No additional management support is needed unless otherwise documented below in the visit note. 

## 2016-04-02 NOTE — Progress Notes (Signed)
Subjective:    Patient ID: Jocelyn Payne, female    DOB: 08-01-1950, 65 y.o.   MRN: [id number]  HPI Here for a welcome to medicare physical   I have personally reviewed the Medicare Annual Wellness questionnaire and have noted 1. The patient's medical and social history 2. Their use of alcohol, tobacco or illicit drugs 3. Their current medications and supplements 4. The patient's functional ability including ADL's, fall risks, home safety risks and hearing or visual             impairment. 5. Diet and physical activities 6. Evidence for depression or mood disorders  The patients weight, height, BMI have been recorded in the chart and visual acuity is per eye clinic.  I have made referrals, counseling and provided education to the patient based review of the above and I have provided the pt with a written personalized care plan for preventive services. Reviewed and updated provider list, see scanned forms.  Has been feeling great   She was just dx with shingles -  Caught it early and she is doing ok  Did not even need pain medicine   See scanned forms.  Routine anticipatory guidance given to patient.  See health maintenance. Colon cancer screening-has not had a colonoscopy and not interested in it  No family hx of colon cancer  Is interested in cologuard for screening  HIV screening  Declined Hep C screening in the past - but may be interested in it in the future (no hx of IV drug abuse) Breast cancer screening mammogram 11/16-negative-she will make her own appt in [place] Self breast exam= no changes  Flu vaccine-declines  Tetanus vaccine 10/12 Pneumovax -declines pneumonia vaccines  dexa -had one when she broke her wrist in 09  She declines medicine for OP She does not take ca and D  Gets a lot of exercise -walks 2 mi a day and does arm exercises  No falls  Zoster vaccine- just had shingles (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 - no worrisome memory changes at all   PMH and SH reviewed  Meds, vitals, and allergies reviewed.   ROS: See HPI.  Otherwise negative.    Wt Readings from Last 3 Encounters:  04/02/16 139 lb 4 oz (63.2 kg)  03/26/16 140 lb (63.5 kg)  07/09/15 139 lb 4 oz (63.2 kg)  quite stable with weight  Has been frying things from the garden - too much fried food  bmi is 26.1   bp is high on the first check today (has whitecoat)  Much improved on 2nd time  BP: 128/70   Last bp 146/70 and had not taken med that day  No cp or palpitations or headaches or edema  No side effects to medicines  BP Readings from Last 3 Encounters:  04/02/16 (!) 166/90  03/26/16 (!) 146/70  07/09/15 (!) 148/82     Hypothyroidism  Pt has no clinical changes No change in energy level/ hair or skin/ edema and no tremor Lab Results  Component Value Date   TSH 3.31 03/26/2016     Hx of hyperlipidemia Lab Results  Component Value Date   CHOL 222 (H) 03/26/2016   CHOL 205 (H) 01/10/2015   CHOL 223 (H) 12/12/2013   Lab Results  Component Value Date   HDL  68.80 03/26/2016   HDL 67.50 01/10/2015   HDL 63.20 12/12/2013   Lab Results  Component Value Date   LDLCALC 135 (H) 03/26/2016   LDLCALC 117 (H) 01/10/2015   LDLCALC 122 (H) 12/12/2013   Lab Results  Component Value Date   TRIG 91.0 03/26/2016   TRIG 103.0 01/10/2015   TRIG 188.0 (H) 12/12/2013   Lab Results  Component Value Date   CHOLHDL 3 03/26/2016   CHOLHDL 3 01/10/2015   CHOLHDL 4 12/12/2013   Lab Results  Component Value Date   LDLDIRECT 137.4 01/03/2013   LDLDIRECT 148.7 10/17/2012   LDLDIRECT 122.4 06/09/2011   LDL is up - on fried food/ fried veg and shrimp   Glucose 104  She did quit soda and watches sweets occ beer - once per week but will occ drink 4 beers -knows this is too much at one time    Patient Active Problem List   Diagnosis Date Noted  . Welcome to Medicare preventive visit 04/02/2016  . Shingles 03/26/2016  . Routine general medical examination at a health care facility 06/08/2011  . HLD (hyperlipidemia) 12/16/2010  . Hypothyroidism 10/07/2010  . ALLERGIC RHINITIS CAUSE UNSPECIFIED 08/12/2010  . Essential hypertension 08/12/2010   Past Medical History:  Diagnosis Date  . Allergic rhinitis, cause unspecified   . Conversion disorder   . HLD (hyperlipidemia)    high LDL  . HTN (hypertension)   . Hypothyroid   . OA (osteoarthritis)    left wrist from fracture    Past Surgical History:  Procedure Laterality Date  . WRIST SURGERY  02/2008   right fracture   Social History  Substance Use Topics  . Smoking status: Former Smoker    Quit date: 10/24/2005  . Smokeless tobacco: Never Used  . Alcohol use 0.0 oz/week     Comment: 6 beers 1 night/weekly   Family History  Problem Relation Age of Onset  . Hypertension Mother   . Cancer Father     Lung and esophageal  . Hypertension Brother   . Hypertension Sister   . Hypothyroidism Sister    No Known Allergies Current Outpatient Prescriptions on File Prior to Visit  Medication Sig Dispense Refill  . acetaminophen (TYLENOL) 325 MG tablet Take 650 mg by mouth every 6 (six) hours as needed.    Marland Kitchen ibuprofen (MOTRIN IB) 200 MG tablet Take 2-3 tablets (400-600 mg total) by mouth every 8 (eight) hours as needed for moderate pain (with food.).     No current facility-administered medications on file prior to visit.     Review of Systems Review of Systems  Constitutional: Negative for fever, appetite change, fatigue and unexpected weight change.  Eyes: Negative for pain and visual disturbance.  Respiratory: Negative for cough and shortness of breath.   Cardiovascular: Negative for cp or palpitations    Gastrointestinal: Negative for nausea, diarrhea and constipation.  Genitourinary: Negative for urgency and  frequency.  Skin: Negative for pallor or rash  (pos for recent shingles that is improved) Neurological: Negative for weakness, light-headedness, numbness and headaches.  Hematological: Negative for adenopathy. Does not bruise/bleed easily.  Psychiatric/Behavioral: Negative for dysphoric mood. The patient is not nervous/anxious.         Objective:   Physical Exam  Constitutional: She appears well-developed and well-nourished. No distress.  Well appearing   HENT:  Head: Normocephalic and atraumatic.  Right Ear: External ear normal.  Left Ear: External ear normal.  Mouth/Throat: Oropharynx is clear and moist.  Eyes: Conjunctivae and EOM are normal. Pupils are equal, round, and reactive to light. No scleral icterus.  Neck: Normal range of motion. Neck supple. No JVD present. Carotid bruit is not present. No thyromegaly present.  Cardiovascular: Normal rate, regular rhythm, normal heart sounds and intact distal pulses.  Exam reveals no gallop.   Pulmonary/Chest: Effort normal and breath sounds normal. No respiratory distress. She has no wheezes. She exhibits no tenderness.  Abdominal: Soft. Bowel sounds are normal. She exhibits no distension, no abdominal bruit and no mass. There is no tenderness.  Genitourinary: No breast swelling, tenderness, discharge or bleeding.  Genitourinary Comments: Breast exam: No mass, nodules, thickening, tenderness, bulging, retraction, inflamation, nipple discharge or skin changes noted.  No axillary or clavicular LA.       Musculoskeletal: Normal range of motion. She exhibits no edema or tenderness.  Lymphadenopathy:    She has no cervical adenopathy.  Neurological: She is alert. She has normal reflexes. No cranial nerve deficit. She exhibits normal muscle tone. Coordination normal.  Skin: Skin is warm and dry. No rash noted. No erythema. No pallor.  Psychiatric: She has a normal mood and affect.          Assessment & Plan:   Problem List Items  Addressed This Visit      Cardiovascular and Mediastinum   Essential hypertension - Primary    bp in fair control at this time  BP Readings from Last 1 Encounters:  04/02/16 128/70   No changes needed Disc lifstyle change with low sodium diet and exercise  (better on 2nd check today)      Relevant Medications   amLODipine (NORVASC) 2.5 MG tablet     Endocrine   Hypothyroidism    Hypothyroidism  Pt has no clinical changes No change in energy level/ hair or skin/ edema and no tremor Lab Results  Component Value Date   TSH 3.31 03/26/2016           Relevant Medications   levothyroxine (SYNTHROID, LEVOTHROID) 75 MCG tablet     Other   Welcome to Medicare preventive visit    Reviewed health habits including diet and exercise and skin cancer prevention Reviewed appropriate screening tests for age  Also reviewed health mt list, fam hx and immunization status , as well as social and family history   See HPI Labs reviewed bp improved on 2nd check We will order the cologuard screening kit for you  Make sure to schedule your mammogram for November -if you don't get a card in the mail - call us for a referral  Try to get 1200-1500 mg of calcium per day with at least 1000 iu of vitamin D - for bone health  Take a look at the blue booklet to work on an advance directive (living will and power of attorney)  Work on better diet for high cholesterol (Avoid red meat/ fried foods/ egg yolks/ fatty breakfast meats/ butter, cheese and high fat dairy/ and shellfish)       HLD (hyperlipidemia)    LDL is up  Likely from more fried food and shellfish lately  Made a plan to back off some trans and sat fats  Disc goals for lipids and reasons to control them Rev labs with pt Rev low sat fat diet in detail       Relevant Medications   amLODipine (NORVASC) 2.5 MG tablet    Other Visit Diagnoses   None.

## 2016-06-29 DIAGNOSIS — Z1212 Encounter for screening for malignant neoplasm of rectum: Secondary | ICD-10-CM | POA: Diagnosis not present

## 2016-06-29 DIAGNOSIS — Z1211 Encounter for screening for malignant neoplasm of colon: Secondary | ICD-10-CM | POA: Diagnosis not present

## 2016-06-29 LAB — COLOGUARD

## 2016-07-12 ENCOUNTER — Telehealth: Payer: Self-pay | Admitting: Family Medicine

## 2016-07-12 DIAGNOSIS — R195 Other fecal abnormalities: Secondary | ICD-10-CM

## 2016-07-12 NOTE — Telephone Encounter (Signed)
Daniell called to make sure you received abnormal color quard result  Press 1 press 2  Order # [id number]

## 2016-07-12 NOTE — Telephone Encounter (Signed)
That is unfortunate - I am happy to do a referral for spring if she would let us -then at least it would be on the books.  If not, that is her decision of course

## 2016-07-12 NOTE — Telephone Encounter (Signed)
Pt called back I advise her of her cologuard results. Pt said that she didn't want to do a colonoscopy now because she is going to be watching her grandchild for a few months. I advised pt again what a positive result means and pt still declined she said she will call back and request referral for a colonoscopy in the future but not right now

## 2016-07-12 NOTE — Telephone Encounter (Signed)
We did receive results, Dr. Glori Bickers placed a note of the results saying "Let her know, and I recommend a colonoscopy.   Called pt and no answer so left voicemail requesting pt to call the office back

## 2016-07-13 ENCOUNTER — Encounter: Payer: Self-pay | Admitting: Internal Medicine

## 2016-07-13 DIAGNOSIS — R195 Other fecal abnormalities: Secondary | ICD-10-CM | POA: Insufficient documentation

## 2016-07-13 NOTE — Telephone Encounter (Signed)
Ref done  Will route to [facility] 

## 2016-07-13 NOTE — Telephone Encounter (Signed)
Pt changed her mind she said her husband is going to have to have a colonoscopy too so he said they both need one, pt said if possible she would like to get it done next month (mid to end of month), please put referral in and I advise pt our [facility] will call to schedule appt

## 2016-07-13 NOTE — Addendum Note (Signed)
Addended by: Loura Pardon A on: 07/13/2016 01:05 PM   Modules accepted: Orders

## 2016-07-14 ENCOUNTER — Telehealth: Payer: Self-pay

## 2016-07-14 NOTE — Telephone Encounter (Signed)
Pt left v/m; pt needs [facility] called to let them know OK to change manufacturers of levothyroxine.

## 2016-07-14 NOTE — Telephone Encounter (Signed)
Ok with me if ok with patient

## 2016-07-14 NOTE — Telephone Encounter (Signed)
Appt made and patient aware.  

## 2016-07-14 NOTE — Telephone Encounter (Signed)
Left voicemail letting [facility] know it's okay to change manufacturers

## 2016-07-15 ENCOUNTER — Encounter: Payer: Self-pay | Admitting: Family Medicine

## 2016-08-24 DIAGNOSIS — Z23 Encounter for immunization: Secondary | ICD-10-CM | POA: Diagnosis not present

## 2016-08-25 ENCOUNTER — Encounter: Payer: Medicare Other | Admitting: Internal Medicine

## 2017-04-11 ENCOUNTER — Other Ambulatory Visit: Payer: Self-pay | Admitting: Family Medicine

## 2017-04-11 NOTE — Telephone Encounter (Signed)
Please schedule f/u and refill until then  

## 2017-04-11 NOTE — Telephone Encounter (Signed)
Appt scheduled and med refilled

## 2017-04-11 NOTE — Telephone Encounter (Signed)
Pt hasn't been seen in over a year and no future appts., please advise  

## 2017-04-24 ENCOUNTER — Telehealth: Payer: Self-pay | Admitting: Family Medicine

## 2017-04-24 DIAGNOSIS — E7800 Pure hypercholesterolemia, unspecified: Secondary | ICD-10-CM

## 2017-04-24 DIAGNOSIS — I10 Essential (primary) hypertension: Secondary | ICD-10-CM

## 2017-04-24 DIAGNOSIS — E039 Hypothyroidism, unspecified: Secondary | ICD-10-CM

## 2017-04-24 NOTE — Telephone Encounter (Signed)
-----   Message from [name] sent at 04/20/2017  2:28 PM EDT ----- Regarding: Lab orders for Wednesday, 10.3.18 Patient is scheduled for CPX labs, please order future labs, Thanks , Karna Christmas

## 2017-04-27 ENCOUNTER — Other Ambulatory Visit (INDEPENDENT_AMBULATORY_CARE_PROVIDER_SITE_OTHER): Payer: Medicare Other

## 2017-04-27 DIAGNOSIS — I10 Essential (primary) hypertension: Secondary | ICD-10-CM | POA: Diagnosis not present

## 2017-04-27 DIAGNOSIS — E039 Hypothyroidism, unspecified: Secondary | ICD-10-CM

## 2017-04-27 DIAGNOSIS — Z85828 Personal history of other malignant neoplasm of skin: Secondary | ICD-10-CM | POA: Diagnosis not present

## 2017-04-27 DIAGNOSIS — E7800 Pure hypercholesterolemia, unspecified: Secondary | ICD-10-CM | POA: Diagnosis not present

## 2017-04-27 DIAGNOSIS — L570 Actinic keratosis: Secondary | ICD-10-CM | POA: Diagnosis not present

## 2017-04-27 DIAGNOSIS — L918 Other hypertrophic disorders of the skin: Secondary | ICD-10-CM | POA: Diagnosis not present

## 2017-04-27 LAB — CBC WITH DIFFERENTIAL/PLATELET
Basophils Absolute: 0.1 10*3/uL (ref 0.0–0.1)
Basophils Relative: 1 % (ref 0.0–3.0)
EOS ABS: 0.2 10*3/uL (ref 0.0–0.7)
Eosinophils Relative: 1.8 % (ref 0.0–5.0)
HCT: 48.6 % — ABNORMAL HIGH (ref 36.0–46.0)
HEMOGLOBIN: 16.2 g/dL — AB (ref 12.0–15.0)
Lymphocytes Relative: 19 % (ref 12.0–46.0)
Lymphs Abs: 1.8 10*3/uL (ref 0.7–4.0)
MCHC: 33.3 g/dL (ref 30.0–36.0)
MCV: 98.5 fl (ref 78.0–100.0)
MONO ABS: 0.8 10*3/uL (ref 0.1–1.0)
Monocytes Relative: 8.7 % (ref 3.0–12.0)
Neutro Abs: 6.6 10*3/uL (ref 1.4–7.7)
Neutrophils Relative %: 69.5 % (ref 43.0–77.0)
Platelets: 505 10*3/uL — ABNORMAL HIGH (ref 150.0–400.0)
RBC: 4.93 Mil/uL (ref 3.87–5.11)
RDW: 13.6 % (ref 11.5–15.5)
WBC: 9.6 10*3/uL (ref 4.0–10.5)

## 2017-04-27 LAB — COMPREHENSIVE METABOLIC PANEL
ALBUMIN: 4.6 g/dL (ref 3.5–5.2)
ALT: 16 U/L (ref 0–35)
AST: 16 U/L (ref 0–37)
Alkaline Phosphatase: 105 U/L (ref 39–117)
BUN: 11 mg/dL (ref 6–23)
CHLORIDE: 102 meq/L (ref 96–112)
CO2: 27 meq/L (ref 19–32)
CREATININE: 0.72 mg/dL (ref 0.40–1.20)
Calcium: 10.2 mg/dL (ref 8.4–10.5)
GFR: 85.95 mL/min (ref >60.00)
Glucose, Bld: 102 mg/dL — ABNORMAL HIGH (ref 70–99)
Potassium: 4 mEq/L (ref 3.5–5.1)
SODIUM: 137 meq/L (ref 135–145)
Total Bilirubin: 0.6 mg/dL (ref 0.2–1.2)
Total Protein: 8.2 g/dL (ref 6.0–8.3)

## 2017-04-27 LAB — LIPID PANEL
CHOL/HDL RATIO: 3
Cholesterol: 198 mg/dL (ref 0–200)
HDL: 64.6 mg/dL (ref >39.00)
LDL CALC: 109 mg/dL — AB (ref 0–99)
NONHDL: 133.31
Triglycerides: 124 mg/dL (ref 0.0–149.0)
VLDL: 24.8 mg/dL (ref 0.0–40.0)

## 2017-04-27 LAB — TSH: TSH: 5.28 u[IU]/mL — AB (ref 0.35–4.50)

## 2017-05-04 ENCOUNTER — Ambulatory Visit (INDEPENDENT_AMBULATORY_CARE_PROVIDER_SITE_OTHER): Payer: Medicare Other | Admitting: Family Medicine

## 2017-05-04 ENCOUNTER — Encounter: Payer: Self-pay | Admitting: Family Medicine

## 2017-05-04 VITALS — BP 150/78 | HR 80 | Temp 98.5°F | Ht 60.0 in | Wt 135.2 lb

## 2017-05-04 DIAGNOSIS — Z7289 Other problems related to lifestyle: Secondary | ICD-10-CM | POA: Diagnosis not present

## 2017-05-04 DIAGNOSIS — E7800 Pure hypercholesterolemia, unspecified: Secondary | ICD-10-CM | POA: Diagnosis not present

## 2017-05-04 DIAGNOSIS — E039 Hypothyroidism, unspecified: Secondary | ICD-10-CM

## 2017-05-04 DIAGNOSIS — I10 Essential (primary) hypertension: Secondary | ICD-10-CM | POA: Diagnosis not present

## 2017-05-04 DIAGNOSIS — R195 Other fecal abnormalities: Secondary | ICD-10-CM

## 2017-05-04 DIAGNOSIS — D751 Secondary polycythemia: Secondary | ICD-10-CM | POA: Diagnosis not present

## 2017-05-04 DIAGNOSIS — F1090 Alcohol use, unspecified, uncomplicated: Secondary | ICD-10-CM

## 2017-05-04 DIAGNOSIS — Z23 Encounter for immunization: Secondary | ICD-10-CM

## 2017-05-04 MED ORDER — AMLODIPINE BESYLATE 5 MG PO TABS: 5.0000 mg | ORAL_TABLET | Freq: Every day | ORAL | 11 refills | Status: DC

## 2017-05-04 MED ORDER — LEVOTHYROXINE SODIUM 88 MCG PO TABS: 88.0000 ug | ORAL_TABLET | Freq: Every day | ORAL | 11 refills | Status: DC

## 2017-05-04 NOTE — Patient Instructions (Addendum)
You had a Tdap in 2012 - so you are up to date (10 year shot)   Flu shot today   Since your cologuard test was positive -so you do need a colonoscopy  Please let us know when you are ready to schedule it     Don't forget to schedule your mammogram for November    We will increase thyroid dose to 88 mcg daily  Re check labs in 2 months   Good job with the cholesterol ! It looks better   You may be a bit dehydrated   Please aim for 64 oz of fluids per day (mostly water)  1 drink per day is the maximum for female liver health -please try to stick to that  Your elevated Hemoglobin may be related to that   Blood donation is a good thing to consider !  Increase your amlodipine to 5 mg daily for blood pressure  Check your blood pressure randomly when you feel relaxed and alert me if above 140 on top or 90 on bottom

## 2017-05-04 NOTE — Progress Notes (Signed)
Subjective:    Patient ID: Jocelyn Payne, female    DOB: 1951-06-29, 66 y.o.   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 at all   Wt Readings from Last 3 Encounters:  05/04/17 135 lb 4 oz (61.3 kg)  04/02/16 139 lb 4 oz (63.2 kg)  03/26/16 140 lb (63.5 kg)  not as much exercise due to child care  26.41 kg/m  Colon cancer screening  She was going to schedule a colonoscopy- had to change mind due to child care for her daughter  Has done the kit -her cologuard was pos and she   dexa - pt has OP per old scan - not open to treatment  No falls or fx  Is very careful  Declines dexa   PNA vaccine-due for PCV 13- she declines   (mother had a bad rxn to it)   Flu shot - will give today   Mammogram 11/16-normal  Self breast exam -no lumps  Has always had a sensitive spot on R breast (when she holds grandchild)  She will schedule herself   utd on Tdap (2012)   bp is up on first check today (she was upset when it was taken)  No cp or palpitations or headaches or edema  No side effects to medicines  BP Readings from Last 3 Encounters:  05/04/17 (!) 156/86  04/02/16 128/70  03/26/16 (!) 146/70    BP: (!) 150/78      Hypothyroidism  Pt has no clinical changes No change in energy level/ hair or skin/ edema and no tremor Lab Results  Component Value Date   TSH 5.28 (H) 04/27/2017    No missed doses   Hyperlipidemia Lab Results  Component Value Date   CHOL 198 04/27/2017   CHOL 222 (H) 03/26/2016   CHOL 205 (H) 01/10/2015   Lab Results  Component Value Date   HDL 64.60 04/27/2017   HDL 68.80 03/26/2016   HDL 67.50 01/10/2015   Lab Results  Component Value Date   LDLCALC 109 (H) 04/27/2017   LDLCALC 135 (H) 03/26/2016   LDLCALC 117 (H) 01/10/2015   Lab Results  Component Value Date   TRIG 124.0 04/27/2017   TRIG 91.0  03/26/2016   TRIG 103.0 01/10/2015   Lab Results  Component Value Date   CHOLHDL 3 04/27/2017   CHOLHDL 3 03/26/2016   CHOLHDL 3 01/10/2015   Lab Results  Component Value Date   LDLDIRECT 137.4 01/03/2013   LDLDIRECT 148.7 10/17/2012   LDLDIRECT 122.4 06/09/2011  a big improvement in LDL  She cut out fried foods !    Lab Results  Component Value Date   WBC 9.6 04/27/2017   HGB 16.2 (H) 04/27/2017   HCT 48.6 (H) 04/27/2017   MCV 98.5 04/27/2017   PLT 505.0 (H) 04/27/2017   non smoker - quit 2007 Not exposed to fumes or smoke  ? If worse with dehydration  Drinks 4 beers per day   Patient Active Problem List   Diagnosis Date Noted  . Alcohol intake above recommended sensible limits 05/05/2017  . Polycythemia 05/04/2017  . Positive colorectal cancer screening using DNA-based stool test 07/13/2016  . Welcome to Medicare preventive visit 04/02/2016  . History of shingles 03/26/2016  . Routine general medical examination at a health care facility  06/08/2011  . HLD (hyperlipidemia) 12/16/2010  . Hypothyroidism 10/07/2010  . ALLERGIC RHINITIS CAUSE UNSPECIFIED 08/12/2010  . Essential hypertension 08/12/2010   Past Medical History:  Diagnosis Date  . Allergic rhinitis, cause unspecified   . Conversion disorder   . HLD (hyperlipidemia)    high LDL  . HTN (hypertension)   . Hypothyroid   . OA (osteoarthritis)    left wrist from fracture    Past Surgical History:  Procedure Laterality Date  . WRIST SURGERY  02/2008   right fracture   Social History  Substance Use Topics  . Smoking status: Former Smoker    Quit date: 10/24/2005  . Smokeless tobacco: Never Used  . Alcohol use 0.0 oz/week     Comment: 4-5 beers at night    Family History  Problem Relation Age of Onset  . Hypertension Mother   . Cancer Father        Lung and esophageal  . Hypertension Brother   . Hypertension Sister   . Hypothyroidism Sister    No Known Allergies Current Outpatient  Prescriptions on File Prior to Visit  Medication Sig Dispense Refill  . acetaminophen (TYLENOL) 325 MG tablet Take 650 mg by mouth every 6 (six) hours as needed.    Marland Kitchen ibuprofen (MOTRIN IB) 200 MG tablet Take 2-3 tablets (400-600 mg total) by mouth every 8 (eight) hours as needed for moderate pain (with food.).     No current facility-administered medications on file prior to visit.      Review of Systems  Constitutional: Positive for fatigue. Negative for activity change, appetite change, fever and unexpected weight change.  HENT: Negative for congestion, ear pain, rhinorrhea, sinus pressure and sore throat.   Eyes: Negative for pain, redness and visual disturbance.  Respiratory: Negative for cough, shortness of breath and wheezing.   Cardiovascular: Negative for chest pain and palpitations.  Gastrointestinal: Negative for abdominal pain, blood in stool, constipation and diarrhea.  Endocrine: Negative for polydipsia and polyuria.  Genitourinary: Negative for dysuria, frequency and urgency.  Musculoskeletal: Negative for arthralgias, back pain and myalgias.  Skin: Negative for pallor and rash.  Allergic/Immunologic: Negative for environmental allergies.  Neurological: Negative for dizziness, syncope and headaches.  Hematological: Negative for adenopathy. Does not bruise/bleed easily.  Psychiatric/Behavioral: Negative for decreased concentration and dysphoric mood. The patient is not nervous/anxious.        Objective:   Physical Exam  Constitutional: She appears well-developed and well-nourished. No distress.  Well appearing   HENT:  Head: Normocephalic and atraumatic.  Right Ear: External ear normal.  Left Ear: External ear normal.  Mouth/Throat: Oropharynx is clear and moist.  Poor dentition noted   Eyes: Pupils are equal, round, and reactive to light. Conjunctivae and EOM are normal. No scleral icterus.  Neck: Normal range of motion. Neck supple. No JVD present. Carotid bruit is  not present. No thyromegaly present.  Cardiovascular: Normal rate, regular rhythm, normal heart sounds and intact distal pulses.  Exam reveals no gallop.   Pulmonary/Chest: Effort normal and breath sounds normal. No respiratory distress. She has no wheezes. She exhibits no tenderness.  Abdominal: Soft. Bowel sounds are normal. She exhibits no distension, no abdominal bruit and no mass. There is no tenderness.  Genitourinary: No breast swelling, tenderness, discharge or bleeding.  Genitourinary Comments: Breast exam: No mass, nodules, thickening, tenderness, bulging, retraction, inflamation, nipple discharge or skin changes noted.  No axillary or clavicular LA.      Musculoskeletal: Normal range of motion. She  exhibits no edema or tenderness.  Lymphadenopathy:    She has no cervical adenopathy.  Neurological: She is alert. She has normal reflexes. She displays no tremor. No cranial nerve deficit. She exhibits normal muscle tone. Coordination normal.  Skin: Skin is warm and dry. No rash noted. No erythema. No pallor.  Solar lentigines diffusely   Psychiatric: She has a normal mood and affect.          Assessment & Plan:   Problem List Items Addressed This Visit      Cardiovascular and Mediastinum   Essential hypertension - Primary    bp is elevated BP Readings from Last 1 Encounters:  05/04/17 (!) 150/78   No changes needed Disc lifstyle change with low sodium diet and exercise  Increase amlodipine to 5 mg daily  She will monitor bp at home       Relevant Medications   amLODipine (NORVASC) 5 MG tablet     Endocrine   Hypothyroidism    Lab Results  Component Value Date   TSH 5.28 (H) 04/27/2017   No missed doses  Inc dose to 88 mcg daily  Re check 2 mo  Pt is taking appropriately      Relevant Medications   levothyroxine (SYNTHROID, LEVOTHROID) 88 MCG tablet     Other   Alcohol intake above recommended sensible limits    Pt drinks 4 beers per day  Does not feel  dependent  Disc safe female intake is 1 drink equiv per day        HLD (hyperlipidemia)    Disc goals for lipids and reasons to control them Rev labs with pt Rev low sat fat diet in detail  LDL is improved with diet-commended        Relevant Medications   amLODipine (NORVASC) 5 MG tablet   Polycythemia    HB has gradually increased  Will watch this- re check 2 mo  No symptoms No exp to smoke or fumes Poor fluid intake-disc imp of inc water  Enc to donate blood as well       Positive colorectal cancer screening using DNA-based stool test    Pt did not follow through with colonoscopy due to child care responsibilities  Enc her to re consider and get help so she can get this done  Stressed imp of this -she will consider        Other Visit Diagnoses    Need for influenza vaccination       Relevant Orders   Flu Vaccine QUAD 6+ mos PF IM (Fluarix Quad PF) (Completed)

## 2017-05-05 DIAGNOSIS — Z7289 Other problems related to lifestyle: Secondary | ICD-10-CM | POA: Insufficient documentation

## 2017-05-05 DIAGNOSIS — F1090 Alcohol use, unspecified, uncomplicated: Secondary | ICD-10-CM | POA: Insufficient documentation

## 2017-05-05 NOTE — Assessment & Plan Note (Signed)
Pt did not follow through with colonoscopy due to child care responsibilities  Enc her to re consider and get help so she can get this done  Stressed imp of this -she will consider

## 2017-05-05 NOTE — Assessment & Plan Note (Signed)
Lab Results  Component Value Date   TSH 5.28 (H) 04/27/2017   No missed doses  Inc dose to 88 mcg daily  Re check 2 mo  Pt is taking appropriately

## 2017-05-05 NOTE — Assessment & Plan Note (Signed)
Disc goals for lipids and reasons to control them Rev labs with pt Rev low sat fat diet in detail  LDL is improved with diet-commended

## 2017-05-05 NOTE — Assessment & Plan Note (Signed)
HB has gradually increased  Will watch this- re check 2 mo  No symptoms No exp to smoke or fumes Poor fluid intake-disc imp of inc water  Enc to donate blood as well

## 2017-05-05 NOTE — Assessment & Plan Note (Signed)
Pt drinks 4 beers per day  Does not feel dependent  Disc safe female intake is 1 drink equiv per day

## 2017-05-05 NOTE — Assessment & Plan Note (Signed)
bp is elevated BP Readings from Last 1 Encounters:  05/04/17 (!) 150/78   No changes needed Disc lifstyle change with low sodium diet and exercise  Increase amlodipine to 5 mg daily  She will monitor bp at home

## 2017-05-19 DIAGNOSIS — S46811A Strain of other muscles, fascia and tendons at shoulder and upper arm level, right arm, initial encounter: Secondary | ICD-10-CM | POA: Diagnosis not present

## 2017-05-19 DIAGNOSIS — M7541 Impingement syndrome of right shoulder: Secondary | ICD-10-CM | POA: Diagnosis not present

## 2017-06-23 DIAGNOSIS — G8929 Other chronic pain: Secondary | ICD-10-CM | POA: Diagnosis not present

## 2017-06-23 DIAGNOSIS — M25532 Pain in left wrist: Secondary | ICD-10-CM | POA: Diagnosis not present

## 2017-06-23 DIAGNOSIS — M94261 Chondromalacia, right knee: Secondary | ICD-10-CM | POA: Diagnosis not present

## 2017-06-23 DIAGNOSIS — M25561 Pain in right knee: Secondary | ICD-10-CM | POA: Diagnosis not present

## 2017-07-06 ENCOUNTER — Other Ambulatory Visit: Payer: Medicare Other

## 2017-07-12 ENCOUNTER — Other Ambulatory Visit: Payer: Self-pay | Admitting: Family Medicine

## 2017-10-05 ENCOUNTER — Other Ambulatory Visit (INDEPENDENT_AMBULATORY_CARE_PROVIDER_SITE_OTHER): Payer: Medicare Other

## 2017-10-05 DIAGNOSIS — D751 Secondary polycythemia: Secondary | ICD-10-CM

## 2017-10-05 DIAGNOSIS — E039 Hypothyroidism, unspecified: Secondary | ICD-10-CM

## 2017-10-05 LAB — CBC WITH DIFFERENTIAL/PLATELET
BASOS ABS: 0.1 10*3/uL (ref 0.0–0.1)
Basophils Relative: 1.2 % (ref 0.0–3.0)
EOS ABS: 0.1 10*3/uL (ref 0.0–0.7)
Eosinophils Relative: 1.3 % (ref 0.0–5.0)
HCT: 43.5 % (ref 36.0–46.0)
Hemoglobin: 14.7 g/dL (ref 12.0–15.0)
LYMPHS ABS: 1.8 10*3/uL (ref 0.7–4.0)
Lymphocytes Relative: 20.2 % (ref 12.0–46.0)
MCHC: 33.8 g/dL (ref 30.0–36.0)
MCV: 97.7 fl (ref 78.0–100.0)
MONO ABS: 0.7 10*3/uL (ref 0.1–1.0)
Monocytes Relative: 8.5 % (ref 3.0–12.0)
NEUTROS PCT: 68.8 % (ref 43.0–77.0)
Neutro Abs: 6 10*3/uL (ref 1.4–7.7)
Platelets: 495 10*3/uL — ABNORMAL HIGH (ref 150.0–400.0)
RBC: 4.45 Mil/uL (ref 3.87–5.11)
RDW: 13 % (ref 11.5–15.5)
WBC: 8.7 10*3/uL (ref 4.0–10.5)

## 2017-10-05 LAB — FERRITIN: FERRITIN: 37.3 ng/mL (ref 10.0–291.0)

## 2017-10-05 LAB — TSH: TSH: 2.55 u[IU]/mL (ref 0.35–4.50)

## 2017-10-20 DIAGNOSIS — M94261 Chondromalacia, right knee: Secondary | ICD-10-CM | POA: Diagnosis not present

## 2018-05-10 DIAGNOSIS — L720 Epidermal cyst: Secondary | ICD-10-CM | POA: Diagnosis not present

## 2018-05-10 DIAGNOSIS — L821 Other seborrheic keratosis: Secondary | ICD-10-CM | POA: Diagnosis not present

## 2018-05-10 DIAGNOSIS — Z85828 Personal history of other malignant neoplasm of skin: Secondary | ICD-10-CM | POA: Diagnosis not present

## 2018-05-10 DIAGNOSIS — L570 Actinic keratosis: Secondary | ICD-10-CM | POA: Diagnosis not present

## 2018-05-10 DIAGNOSIS — L718 Other rosacea: Secondary | ICD-10-CM | POA: Diagnosis not present

## 2018-05-10 DIAGNOSIS — D1801 Hemangioma of skin and subcutaneous tissue: Secondary | ICD-10-CM | POA: Diagnosis not present

## 2018-05-15 ENCOUNTER — Telehealth: Payer: Self-pay | Admitting: *Deleted

## 2018-05-15 NOTE — Telephone Encounter (Signed)
Lab appointment scheduled on 05/22/18.

## 2018-05-15 NOTE — Telephone Encounter (Signed)
Copied from [place] [id number]. Topic: General - Other >> May 15, 2018 12:10 PM Ivar Drape wrote: Reason for CRM:   Patient needs orders put in Epic to check her Thyroid to get refills on her medication.  She can do an early morning appointment any day next week.  Just let her know what time you schedule her for.

## 2018-05-21 ENCOUNTER — Telehealth: Payer: Self-pay | Admitting: Family Medicine

## 2018-05-21 DIAGNOSIS — E039 Hypothyroidism, unspecified: Secondary | ICD-10-CM

## 2018-05-21 NOTE — Telephone Encounter (Signed)
-----   Message from Lendon Collar, RT sent at 05/16/2018  9:35 AM EDT ----- Regarding: Lab orders for Monday 05/22/18 Please enter lab orders for 05/22/18. Thanks!

## 2018-05-22 ENCOUNTER — Other Ambulatory Visit (INDEPENDENT_AMBULATORY_CARE_PROVIDER_SITE_OTHER): Payer: Medicare Other

## 2018-05-22 DIAGNOSIS — E039 Hypothyroidism, unspecified: Secondary | ICD-10-CM | POA: Diagnosis not present

## 2018-05-22 LAB — TSH: TSH: 1.91 u[IU]/mL (ref 0.35–4.50)

## 2018-05-23 ENCOUNTER — Telehealth: Payer: Self-pay | Admitting: *Deleted

## 2018-05-23 MED ORDER — LEVOTHYROXINE SODIUM 88 MCG PO TABS: 88.0000 ug | ORAL_TABLET | Freq: Every day | ORAL | 3 refills | Status: DC

## 2018-05-23 NOTE — Telephone Encounter (Signed)
-----   Message from Abner Greenspan, MD sent at 05/22/2018  7:43 PM EDT ----- Please refill levothyroxine for a year at current dose

## 2018-05-29 ENCOUNTER — Telehealth: Payer: Self-pay | Admitting: Family Medicine

## 2018-05-29 DIAGNOSIS — E039 Hypothyroidism, unspecified: Secondary | ICD-10-CM

## 2018-05-29 DIAGNOSIS — E7800 Pure hypercholesterolemia, unspecified: Secondary | ICD-10-CM

## 2018-05-29 DIAGNOSIS — D751 Secondary polycythemia: Secondary | ICD-10-CM

## 2018-05-29 DIAGNOSIS — I10 Essential (primary) hypertension: Secondary | ICD-10-CM

## 2018-05-29 NOTE — Telephone Encounter (Signed)
-----   Message from Eustace Pen, LPN sent at 21/09/863  3:02 PM EST ----- Regarding: Labs 11/6 Lab orders needed. Thank you.  Insurance:  [employer]

## 2018-05-31 ENCOUNTER — Ambulatory Visit: Payer: Medicare Other

## 2018-05-31 ENCOUNTER — Other Ambulatory Visit (INDEPENDENT_AMBULATORY_CARE_PROVIDER_SITE_OTHER): Payer: Medicare Other

## 2018-05-31 ENCOUNTER — Telehealth: Payer: Self-pay | Admitting: Family Medicine

## 2018-05-31 DIAGNOSIS — I10 Essential (primary) hypertension: Secondary | ICD-10-CM

## 2018-05-31 DIAGNOSIS — D751 Secondary polycythemia: Secondary | ICD-10-CM

## 2018-05-31 DIAGNOSIS — E039 Hypothyroidism, unspecified: Secondary | ICD-10-CM | POA: Diagnosis not present

## 2018-05-31 DIAGNOSIS — E7800 Pure hypercholesterolemia, unspecified: Secondary | ICD-10-CM | POA: Diagnosis not present

## 2018-05-31 LAB — LIPID PANEL
Cholesterol: 195 mg/dL (ref 0–200)
HDL: 78.9 mg/dL (ref >39.00)
LDL CALC: 93 mg/dL (ref 0–99)
NonHDL: 116.23
Total CHOL/HDL Ratio: 2
Triglycerides: 114 mg/dL (ref 0.0–149.0)
VLDL: 22.8 mg/dL (ref 0.0–40.0)

## 2018-05-31 LAB — CBC WITH DIFFERENTIAL/PLATELET
Basophils Absolute: 0.1 10*3/uL (ref 0.0–0.1)
Basophils Relative: 0.8 % (ref 0.0–3.0)
EOS PCT: 1.2 % (ref 0.0–5.0)
Eosinophils Absolute: 0.1 10*3/uL (ref 0.0–0.7)
HEMATOCRIT: 46.3 % — AB (ref 36.0–46.0)
HEMOGLOBIN: 15.5 g/dL — AB (ref 12.0–15.0)
Lymphocytes Relative: 20.4 % (ref 12.0–46.0)
Lymphs Abs: 1.8 10*3/uL (ref 0.7–4.0)
MCHC: 33.6 g/dL (ref 30.0–36.0)
MCV: 97.3 fl (ref 78.0–100.0)
MONOS PCT: 8 % (ref 3.0–12.0)
Monocytes Absolute: 0.7 10*3/uL (ref 0.1–1.0)
Neutro Abs: 6 10*3/uL (ref 1.4–7.7)
Neutrophils Relative %: 69.6 % (ref 43.0–77.0)
Platelets: 452 10*3/uL — ABNORMAL HIGH (ref 150.0–400.0)
RBC: 4.75 Mil/uL (ref 3.87–5.11)
RDW: 13.1 % (ref 11.5–15.5)
WBC: 8.7 10*3/uL (ref 4.0–10.5)

## 2018-05-31 LAB — COMPREHENSIVE METABOLIC PANEL
ALBUMIN: 4.7 g/dL (ref 3.5–5.2)
ALK PHOS: 85 U/L (ref 39–117)
ALT: 18 U/L (ref 0–35)
AST: 21 U/L (ref 0–37)
BUN: 12 mg/dL (ref 6–23)
CHLORIDE: 100 meq/L (ref 96–112)
CO2: 28 mEq/L (ref 19–32)
Calcium: 10.2 mg/dL (ref 8.4–10.5)
Creatinine, Ser: 0.67 mg/dL (ref 0.40–1.20)
GFR: 93.09 mL/min (ref >60.00)
Glucose, Bld: 86 mg/dL (ref 70–99)
POTASSIUM: 4.1 meq/L (ref 3.5–5.1)
SODIUM: 135 meq/L (ref 135–145)
TOTAL PROTEIN: 7.9 g/dL (ref 6.0–8.3)
Total Bilirubin: 0.6 mg/dL (ref 0.2–1.2)

## 2018-05-31 LAB — TSH: TSH: 1.35 u[IU]/mL (ref 0.35–4.50)

## 2018-05-31 NOTE — Telephone Encounter (Signed)
Spoke with spouse.  He stated he would have pt to call back to r/s her medicare wellness appointment with lisa

## 2018-06-11 DIAGNOSIS — Z23 Encounter for immunization: Secondary | ICD-10-CM | POA: Diagnosis not present

## 2018-06-14 ENCOUNTER — Encounter: Payer: Self-pay | Admitting: Family Medicine

## 2018-06-14 ENCOUNTER — Ambulatory Visit (INDEPENDENT_AMBULATORY_CARE_PROVIDER_SITE_OTHER): Payer: Medicare Other | Admitting: Family Medicine

## 2018-06-14 VITALS — BP 134/78 | HR 87 | Temp 98.1°F | Ht 61.25 in | Wt 129.8 lb

## 2018-06-14 DIAGNOSIS — Z7289 Other problems related to lifestyle: Secondary | ICD-10-CM | POA: Diagnosis not present

## 2018-06-14 DIAGNOSIS — Z0000 Encounter for general adult medical examination without abnormal findings: Secondary | ICD-10-CM

## 2018-06-14 DIAGNOSIS — E7800 Pure hypercholesterolemia, unspecified: Secondary | ICD-10-CM | POA: Diagnosis not present

## 2018-06-14 DIAGNOSIS — D751 Secondary polycythemia: Secondary | ICD-10-CM | POA: Diagnosis not present

## 2018-06-14 DIAGNOSIS — F1090 Alcohol use, unspecified, uncomplicated: Secondary | ICD-10-CM

## 2018-06-14 DIAGNOSIS — E039 Hypothyroidism, unspecified: Secondary | ICD-10-CM

## 2018-06-14 DIAGNOSIS — R195 Other fecal abnormalities: Secondary | ICD-10-CM | POA: Diagnosis not present

## 2018-06-14 DIAGNOSIS — I10 Essential (primary) hypertension: Secondary | ICD-10-CM | POA: Diagnosis not present

## 2018-06-14 MED ORDER — LEVOTHYROXINE SODIUM 88 MCG PO TABS: 88.0000 ug | ORAL_TABLET | Freq: Every day | ORAL | 3 refills | Status: DC

## 2018-06-14 MED ORDER — AMLODIPINE BESYLATE 5 MG PO TABS: 5.0000 mg | ORAL_TABLET | Freq: Every day | ORAL | 3 refills | Status: DC

## 2018-06-14 NOTE — Patient Instructions (Addendum)
If you are interested in the new shingles vaccine (Shingrix) - call your local pharmacy to check on coverage and availability  If affordable, get on a wait list at your pharmacy to get the vaccine.  Work on an Forensic scientist - see the blue packet - bring it back and we will scan a copy   Take care of yourself Let us know if /when you want to do a colonoscopy   Donate blood if you get a chance  Try to limit alcohol intake as well   Labs are stable   Stop up front and we will send for your mammogram report

## 2018-06-14 NOTE — Progress Notes (Signed)
Subjective:    Patient ID: Jocelyn Payne, female    DOB: 11-03-1950, 67 y.o.   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 The patient's medical and social history 2. Their use of alcohol, tobacco or illicit drugs 3. Their current medications and supplements 4. The patient's functional ability including ADL's, fall risks, home safety risks and hearing or visual             impairment. 5. Diet and physical activities 6. Evidence for depression or mood disorders  The patients weight, height, BMI have been recorded in the chart and visual acuity is per eye clinic.  I have made referrals, counseling and provided education to the patient based review of the above and I have provided the pt with a written personalized care plan for preventive services. Reviewed and updated provider list, see scanned forms.  See scanned forms.  Routine anticipatory guidance given to patient.  See health maintenance. Colon cancer screening= pos cologuard 12/17 Declines further intervention (not until she has time)  Breast cancer screening mammogram - 1/19 at [facility] Self breast exam- no lumps  Flu vaccine this Sunday at [facility]- has a red arm (high dose) Tetanus vaccine 10/12 Pneumovax-declines  Zoster -had shingles - is interested in shingrix  dexa (arm fx in the past)- OP in 09?  Declines tx   etoh intake same 4 drinks per day (aware is too much - is not willing to change at all)  Caffeine intake -2-3 cups of coffee per day and occ dr pepper   Advance  directive-does not have/needs to work on  Cognitive function addressed- see scanned forms- and if abnormal then additional documentation follows. -no concerns   PMH and SH reviewed  Meds, vitals, and allergies reviewed.   ROS: See HPI.  Otherwise negative.     Hearing Screening   125Hz  250Hz  500Hz  1000Hz  2000Hz  3000Hz  4000Hz  6000Hz  8000Hz   Right ear:   40 40 40  40    Left ear:   40 40 40  40    Vision Screening Comments: Pt had eye exam in Dec 2018 with Dr. Francella Solian  bp is stable today  No cp or palpitations or headaches or edema  No side effects to medicines  BP Readings from Last 3 Encounters:  06/14/18 134/78  05/04/17 (!) 150/78  04/02/16 128/70       Hypothyroidism  Pt has no clinical changes No change in energy level/ hair or skin/ edema and no tremor Lab Results  Component Value Date   TSH 1.35 05/31/2018      Hyperlipidemia  Lab Results  Component Value Date   CHOL 195 05/31/2018   CHOL 198 04/27/2017   CHOL 222 (H) 03/26/2016   Lab Results  Component Value Date   HDL 78.90 05/31/2018   HDL 64.60 04/27/2017   HDL 68.80 03/26/2016   Lab Results  Component Value Date   LDLCALC 93 05/31/2018  LDLCALC 109 (H) 04/27/2017   LDLCALC 135 (H) 03/26/2016   Lab Results  Component Value Date   TRIG 114.0 05/31/2018   TRIG 124.0 04/27/2017   TRIG 91.0 03/26/2016   Lab Results  Component Value Date   CHOLHDL 2 05/31/2018   CHOLHDL 3 04/27/2017   CHOLHDL 3 03/26/2016   Lab Results  Component Value Date   LDLDIRECT 137.4 01/03/2013   LDLDIRECT 148.7 10/17/2012   LDLDIRECT 122.4 06/09/2011   is eating less in general Not eating out now  No fast food LDL is down and HDL is up  Trying to cook healthy food for family   Polycythemia Lab Results  Component Value Date   WBC 8.7 05/31/2018   HGB 15.5 (H) 05/31/2018   HCT 46.3 (H) 05/31/2018   MCV 97.3 05/31/2018   PLT 452.0 (H) 05/31/2018   platelet ct is improved  Not smoking or exp to fumes No  time to donate blood Quit smoking in 2007   Lab Results  Component Value Date   CREATININE 0.67 05/31/2018   BUN 12 05/31/2018   NA 135 05/31/2018   K 4.1 05/31/2018   CL 100 05/31/2018   CO2 28 05/31/2018   Lab Results  Component Value Date   ALT 18 05/31/2018   AST 21 05/31/2018   ALKPHOS 85 05/31/2018   BILITOT 0.6 05/31/2018    Patient Active Problem List   Diagnosis Date Noted  . Medicare annual wellness visit, initial 06/14/2018  . Alcohol intake above recommended sensible limits 05/05/2017  . Polycythemia 05/04/2017  . Positive colorectal cancer screening using DNA-based stool test 07/13/2016  . Welcome to Medicare preventive visit 04/02/2016  . History of shingles 03/26/2016  . Routine general medical examination at a health care facility 06/08/2011  . HLD (hyperlipidemia) 12/16/2010  . Hypothyroidism 10/07/2010  . ALLERGIC RHINITIS CAUSE UNSPECIFIED 08/12/2010  . Essential hypertension 08/12/2010   Past Medical History:  Diagnosis Date  . Allergic rhinitis, cause unspecified   . Conversion disorder   . HLD (hyperlipidemia)    high LDL  . HTN (hypertension)   . Hypothyroid   . OA (osteoarthritis)    left wrist from fracture    Past Surgical History:  Procedure Laterality Date  . WRIST SURGERY  02/2008   right fracture   Social History   Tobacco Use  . Smoking status: Former Smoker    Last attempt to quit: 10/24/2005    Years since quitting: 12.6  . Smokeless tobacco: Never Used  Substance Use Topics  . Alcohol use: Yes    Alcohol/week: 0.0 standard drinks    Comment: 4-5 beers at night   . Drug use: No   Family History  Problem Relation Age of Onset  . Hypertension Mother   . Cancer Father        Lung and esophageal  . Hypertension Brother   . Hypertension Sister   . Hypothyroidism Sister    No Known Allergies No current outpatient medications on file prior to visit.   No current facility-administered medications on file prior to visit.        Review of Systems  Constitutional: Positive for fatigue. Negative for activity change, appetite change, fever and unexpected weight change.  HENT: Negative for congestion, ear pain, rhinorrhea, sinus pressure and sore throat.   Eyes: Negative for pain, redness and visual disturbance.  Respiratory: Negative for cough, shortness of breath and wheezing.   Cardiovascular: Negative for chest pain and palpitations.  Gastrointestinal: Negative  for abdominal pain, blood in stool, constipation and diarrhea.  Endocrine: Negative for polydipsia and polyuria.  Genitourinary: Negative for dysuria, frequency and urgency.  Musculoskeletal: Negative for arthralgias, back pain and myalgias.  Skin: Negative for pallor and rash.  Allergic/Immunologic: Negative for environmental allergies.  Neurological: Negative for dizziness, syncope and headaches.  Hematological: Negative for adenopathy. Does not bruise/bleed easily.  Psychiatric/Behavioral: Negative for decreased concentration and dysphoric mood. The patient is not nervous/anxious.        Objective:   Physical Exam  Constitutional: She appears well-developed and well-nourished. No distress.  Well appearing   HENT:  Head: Normocephalic and atraumatic.  Right Ear: External ear normal.  Left Ear: External ear normal.  Mouth/Throat: Oropharynx is clear and moist.  Boggy nares   Eyes: Pupils are equal, round, and reactive to light. Conjunctivae and EOM are normal. No scleral icterus.  Neck: Normal range of motion. Neck supple. No JVD present. Carotid bruit is not present. No thyromegaly present.  Cardiovascular: Normal rate, regular rhythm, normal heart sounds and intact distal pulses. Exam reveals no gallop.  Pulmonary/Chest: Effort normal and breath sounds normal. No respiratory distress. She has no wheezes. She has no rales. She exhibits no tenderness. No breast tenderness, discharge or bleeding.  Abdominal: Soft. Bowel sounds are normal. She  exhibits no distension, no abdominal bruit and no mass. There is no tenderness.  Genitourinary: No breast tenderness, discharge or bleeding.  Genitourinary Comments: Breast exam: No mass, nodules, thickening, tenderness, bulging, retraction, inflamation, nipple discharge or skin changes noted.  No axillary or clavicular LA.      Musculoskeletal: Normal range of motion. She exhibits no edema or tenderness.  Lymphadenopathy:    She has no cervical adenopathy.  Neurological: She is alert. She has normal reflexes. She displays normal reflexes. No cranial nerve deficit. She exhibits normal muscle tone. Coordination normal.  Skin: Skin is warm and dry. No rash noted. No erythema. No pallor.  Rosacea on face lower extremities   Psychiatric: She has a normal mood and affect. Cognition and memory are normal.  Pleasant           Assessment & Plan:   Problem List Items Addressed This Visit      Cardiovascular and Mediastinum   Essential hypertension    bp in fair control at this time  BP Readings from Last 1 Encounters:  06/14/18 134/78   No changes needed Most recent labs reviewed  Disc lifstyle change with low sodium diet and exercise        Relevant Medications   amLODipine (NORVASC) 5 MG tablet     Endocrine   Hypothyroidism    Hypothyroidism  Pt has no clinical changes No change in energy level/ hair or skin/ edema and no tremor Lab Results  Component Value Date   TSH 1.35 05/31/2018           Relevant Medications   levothyroxine (SYNTHROID, LEVOTHROID) 88 MCG tablet     Other   Positive colorectal cancer screening using DNA-based stool test    Pt continues to refuse further eval Urged her to re consider when she has time for a colonoscopy  No symptoms       Polycythemia    High HB is fairly stable  No symptoms Enc to donate blood       Medicare annual wellness visit, initial - Primary    Reviewed health habits including diet and exercise and skin cancer  prevention Reviewed appropriate screening tests for  age  Also reviewed health mt list, fam hx and immunization status , as well as social and family history    Labs rev  Strongly enc to cut back on etoh consumption  Declines colonoscoy (in setting of pos cologuard)  Declines dexa or tx of OP (enc ca and D) Declines pneumovax Disc shingrix-interested/will get on wait list at pharmacy Strongly enc to work on adv directive and packent given        HLD (hyperlipidemia)    Disc goals for lipids and reasons to control them Rev last labs with pt Rev low sat fat diet in detail Improved LDL and HDL       Relevant Medications   amLODipine (NORVASC) 5 MG tablet   Alcohol intake above recommended sensible limits    4 beers per day  Enc strongly to cut intake to 1 or less drinks daily Denies dependence Declines intervention/no plan to change habits

## 2018-06-15 NOTE — Assessment & Plan Note (Signed)
High HB is fairly stable  No symptoms Enc to donate blood

## 2018-06-15 NOTE — Assessment & Plan Note (Signed)
bp in fair control at this time  BP Readings from Last 1 Encounters:  06/14/18 134/78   No changes needed Most recent labs reviewed  Disc lifstyle change with low sodium diet and exercise

## 2018-06-15 NOTE — Assessment & Plan Note (Signed)
Reviewed health habits including diet and exercise and skin cancer prevention Reviewed appropriate screening tests for age  Also reviewed health mt list, fam hx and immunization status , as well as social and family history    Labs rev  Strongly enc to cut back on etoh consumption  Declines colonoscoy (in setting of pos cologuard)  Declines dexa or tx of OP (enc ca and D) Declines pneumovax Disc shingrix-interested/will get on wait list at pharmacy Strongly enc to work on adv directive and packent given

## 2018-06-15 NOTE — Assessment & Plan Note (Signed)
Disc goals for lipids and reasons to control them Rev last labs with pt Rev low sat fat diet in detail Improved LDL and HDL

## 2018-06-15 NOTE — Assessment & Plan Note (Signed)
4 beers per day  Enc strongly to cut intake to 1 or less drinks daily Denies dependence Declines intervention/no plan to change habits

## 2018-06-15 NOTE — Assessment & Plan Note (Signed)
Hypothyroidism  Pt has no clinical changes No change in energy level/ hair or skin/ edema and no tremor Lab Results  Component Value Date   TSH 1.35 05/31/2018

## 2018-06-15 NOTE — Assessment & Plan Note (Signed)
Pt continues to refuse further eval Urged her to re consider when she has time for a colonoscopy  No symptoms

## 2018-07-14 ENCOUNTER — Telehealth: Payer: Self-pay

## 2018-07-14 ENCOUNTER — Encounter: Payer: Self-pay | Admitting: Primary Care

## 2018-07-14 ENCOUNTER — Ambulatory Visit (INDEPENDENT_AMBULATORY_CARE_PROVIDER_SITE_OTHER): Payer: Medicare Other | Admitting: Primary Care

## 2018-07-14 VITALS — BP 150/80 | HR 103 | Temp 99.7°F | Ht 61.25 in | Wt 132.0 lb

## 2018-07-14 DIAGNOSIS — R509 Fever, unspecified: Secondary | ICD-10-CM

## 2018-07-14 LAB — POCT INFLUENZA A/B
Influenza A, POC: NEGATIVE
Influenza B, POC: NEGATIVE

## 2018-07-14 NOTE — Patient Instructions (Signed)
Your symptoms are representative of a viral illness which will resolve on its own over time. Our goal is to treat your symptoms in order to aid your body in the healing process and to make you more comfortable.   Start Tylenol 650 mg every 8 hours as needed for fevers, headaches, body aches.  You can take Delsym or Robitussin as needed for cough.  Ensure you are consuming 64 ounces of water daily.  Please notify me if you develop persistent fevers of 101 and/or feel worse after 1 week of onset of symptoms.   It was a pleasure meeting you!   Upper Respiratory Infection, Adult An upper respiratory infection (URI) affects the nose, throat, and upper air passages. URIs are caused by germs (viruses). The most common type of URI is often called "the common cold." Medicines cannot cure URIs, but you can do things at home to relieve your symptoms. URIs usually get better within 7-10 days. Follow these instructions at home: Activity  Rest as needed.  If you have a fever, stay home from work or school until your fever is gone, or until your doctor says you may return to work or school. ? You should stay home until you cannot spread the infection anymore (you are not contagious). ? Your doctor may have you wear a face mask so you have less risk of spreading the infection. Relieving symptoms  Gargle with a salt-water mixture 3-4 times a day or as needed. To make a salt-water mixture, completely dissolve -1 tsp of salt in 1 cup of warm water.  Use a cool-mist humidifier to add moisture to the air. This can help you breathe more easily. Eating and drinking   Drink enough fluid to keep your pee (urine) pale yellow.  Eat soups and other clear broths. General instructions   Take over-the-counter and prescription medicines only as told by your doctor. These include cold medicines, fever reducers, and cough suppressants.  Do not use any products that contain nicotine or tobacco. These include  cigarettes and e-cigarettes. If you need help quitting, ask your doctor.  Avoid being where people are smoking (avoid secondhand smoke).  Make sure you get regular shots and get the flu shot every year.  Keep all follow-up visits as told by your doctor. This is important. How to avoid spreading infection to others   Wash your hands often with soap and water. If you do not have soap and water, use hand sanitizer.  Avoid touching your mouth, face, eyes, or nose.  Cough or sneeze into a tissue or your sleeve or elbow. Do not cough or sneeze into your hand or into the air. Contact a doctor if:  You are getting worse, not better.  You have any of these: ? A fever. ? Chills. ? Brown or red mucus in your nose. ? Yellow or brown fluid (discharge)coming from your nose. ? Pain in your face, especially when you bend forward. ? Swollen neck glands. ? Pain with swallowing. ? White areas in the back of your throat. Get help right away if:  You have shortness of breath that gets worse.  You have very bad or constant: ? Headache. ? Ear pain. ? Pain in your forehead, behind your eyes, and over your cheekbones (sinus pain). ? Chest pain.  You have long-lasting (chronic) lung disease along with any of these: ? Wheezing. ? Long-lasting cough. ? Coughing up blood. ? A change in your usual mucus.  You have a stiff neck.  You have changes in your: ? Vision. ? Hearing. ? Thinking. ? Mood. Summary  An upper respiratory infection (URI) is caused by a germ called a virus. The most common type of URI is often called "the common cold."  URIs usually get better within 7-10 days.  Take over-the-counter and prescription medicines only as told by your doctor. This information is not intended to replace advice given to you by your health care provider. Make sure you discuss any questions you have with your health care provider. Document Released: 12/29/2007 Document Revised: 03/04/2017  Document Reviewed: 03/04/2017 Elsevier Interactive Patient Education  2019 [employer].

## 2018-07-14 NOTE — Telephone Encounter (Signed)
Noted and will evaluate.  

## 2018-07-14 NOTE — Progress Notes (Signed)
Subjective:    Patient ID: Jocelyn Payne, female    DOB: 08-23-1950, 67 y.o.   MRN: [id number]  HPI  Jocelyn Payne is a 67 year old female with a history of hypertension, tobacco abuse, allergic rhinitis, shingles who presents today with a chief complaint of cough.  She also reports rhinorrhea, chills, fever. Her symptoms began last night. She was sick with a "cold" several weeks before her symptoms, did recover completely before symptoms began.   Fever last night was 99.8, this morning was 100.5. She's been around her husband who has a sinus infection. She's not taken Tylenol or Ibuprofen or anything OTC for her symptoms.   Review of Systems  Constitutional: Positive for chills and fever.  HENT: Positive for congestion and rhinorrhea.   Respiratory: Positive for cough. Negative for shortness of breath.   Cardiovascular: Negative for chest pain.       Past Medical History:  Diagnosis Date  . Allergic rhinitis, cause unspecified   . Conversion disorder   . HLD (hyperlipidemia)    high LDL  . HTN (hypertension)   . Hypothyroid   . OA (osteoarthritis)    left wrist from fracture      Social History   Socioeconomic History  . Marital status: Married    Spouse name: Not on file  . Number of children: Not on file  . Years of education: Not on file  . Highest education level: Not on file  Occupational History  . Not on file  Social Needs  . Financial resource strain: Not on file  . Food insecurity:    Worry: Not on file    Inability: Not on file  . Transportation needs:    Medical: Not on file    Non-medical: Not on file  Tobacco Use  . Smoking status: Former Smoker    Last attempt to quit: 10/24/2005    Years since quitting: 12.7  . Smokeless tobacco: Never Used  Substance and Sexual Activity  . Alcohol use: Yes    Alcohol/week: 0.0 standard drinks    Comment: 4-5 beers at night   . Drug use: No  . Sexual activity: Not on file  Lifestyle  . Physical activity:   Days per week: Not on file    Minutes per session: Not on file  . Stress: Not on file  Relationships  . Social connections:    Talks on phone: Not on file    Gets together: Not on file    Attends religious service: Not on file    Active member of club or organization: Not on file    Attends meetings of clubs or organizations: Not on file    Relationship status: Not on file  . Intimate partner violence:    Fear of current or ex partner: Not on file    Emotionally abused: Not on file    Physically abused: Not on file    Forced sexual activity: Not on file  Other Topics Concern  . Not on file  Social History Narrative  . Not on file    Past Surgical History:  Procedure Laterality Date  . WRIST SURGERY  02/2008   right fracture    Family History  Problem Relation Age of Onset  . Hypertension Mother   . Cancer Father        Lung and esophageal  . Hypertension Brother   . Hypertension Sister   . Hypothyroidism Sister     No Known Allergies  Current Outpatient Medications on File Prior to Visit  Medication Sig Dispense Refill  . amLODipine (NORVASC) 5 MG tablet Take 1 tablet (5 mg total) by mouth daily. 90 tablet 3  . levothyroxine (SYNTHROID, LEVOTHROID) 88 MCG tablet Take 1 tablet (88 mcg total) by mouth daily. In am before breakfast 90 tablet 3   No current facility-administered medications on file prior to visit.     BP (!) 150/80 (BP Location: Left Arm, Patient Position: Sitting, Cuff Size: Normal)   Pulse (!) 103   Temp 99.7 F (37.6 C) (Oral)   Ht 5' 1.25" (1.556 m)   Wt 132 lb (59.9 kg)   SpO2 97%   BMI 24.74 kg/m    Objective:   Physical Exam  Constitutional: She appears well-nourished. She does not appear ill.  HENT:  Right Ear: Tympanic membrane and ear canal normal.  Left Ear: Tympanic membrane and ear canal normal.  Nose: Mucosal edema present. Right sinus exhibits no maxillary sinus tenderness and no frontal sinus tenderness. Left sinus exhibits  no maxillary sinus tenderness and no frontal sinus tenderness.  Mouth/Throat: Oropharynx is clear and moist.  Neck: Neck supple.  Cardiovascular: Normal rate and regular rhythm.  Respiratory: Effort normal and breath sounds normal. She has no wheezes.  Lymphadenopathy:    She has no cervical adenopathy.  Skin: Skin is warm.  Slightly clammy skin           Assessment & Plan:  URI:  Cough, fever, chills x <24 hours. Has not taken anything OTC. Rapid Flu today negative. Doesn't appear to look like strep, lung sounds clear. Do suspect viral cause and will treat with conservative measures. Discussed use of Tylenol 650 mg TID PRN, Delsym/Robitussin PRN. Fluids, rest, return precautions provided.  Pleas Koch, NP

## 2018-07-14 NOTE — Telephone Encounter (Signed)
[facility] faxed note; pt had a cold for 1 wk; pt woke up with fever, T 100 and cough with runny nose. Pt has appt to see Gentry Fitz NP 07/14/18 at 2:40 PM. [facility] note sent for scanning and in Gentry Fitz NP in box.

## 2018-07-17 ENCOUNTER — Encounter: Payer: Self-pay | Admitting: Family Medicine

## 2018-07-17 ENCOUNTER — Ambulatory Visit (INDEPENDENT_AMBULATORY_CARE_PROVIDER_SITE_OTHER): Payer: Medicare Other | Admitting: Family Medicine

## 2018-07-17 ENCOUNTER — Telehealth: Payer: Self-pay

## 2018-07-17 VITALS — BP 176/48 | HR 121 | Temp 103.0°F | Ht 61.25 in | Wt 130.2 lb

## 2018-07-17 DIAGNOSIS — J189 Pneumonia, unspecified organism: Secondary | ICD-10-CM | POA: Insufficient documentation

## 2018-07-17 DIAGNOSIS — J111 Influenza due to unidentified influenza virus with other respiratory manifestations: Secondary | ICD-10-CM

## 2018-07-17 MED ORDER — OSELTAMIVIR PHOSPHATE 75 MG PO CAPS: 75.0000 mg | ORAL_CAPSULE | Freq: Two times a day (BID) | ORAL | 0 refills | Status: DC

## 2018-07-17 MED ORDER — HYDROCODONE-HOMATROPINE 5-1.5 MG/5ML PO SYRP: 5.0000 mL | ORAL_SOLUTION | Freq: Three times a day (TID) | ORAL | 0 refills | Status: DC | PRN

## 2018-07-17 NOTE — Telephone Encounter (Signed)
Pt seen on 07/14/18; over weekend fever consistently been 104. Pt taking Tylenol q 8 h. Has prod cough and not sure color of phlegm. Aching all over; flu test on 07/14/18 neg. No S/T or earache. Gentry Fitz NP said needs reeval. Pt has appt with Dr Glori Bickers 07/17/18 at 10 AM.

## 2018-07-17 NOTE — Progress Notes (Signed)
Subjective:    Patient ID: Jocelyn Payne, female    DOB: 1951-05-14, 67 y.o.   MRN: [id number]  HPI Here for uri symptoms with fever   Was seen on 12/20  Presented with rhinorrhea/chills/fever  Was sick with a cold several weeks prior Exp to husband with sinus infection  Results for orders placed or performed in visit on 07/14/18  POCT Influenza A/B  Result Value Ref Range   Influenza A, POC Negative Negative   Influenza B, POC Negative Negative   neg flu tests at the time  Recommended tylenol and delsym or robitussin   Today pulse 121 Temp of 103  (has had temp of 104 several times)  Pulse ox 96%  Taking 1000 mg of acetaminophen every 6 hours  Also occ advil (gives her a little diarrhea)  No appetite  Vomited once after a bad cough spell   bp up-poss due to discomfort BP Readings from Last 3 Encounters:  07/17/18 (!) 176/48  07/14/18 (!) 150/80  06/14/18 134/78   Dry cough  Not a lot of nasal d/c- clear  No facial pain    Had flu vaccine 11/19   Patient Active Problem List   Diagnosis Date Noted  . Influenza 07/17/2018  . Medicare annual wellness visit, initial 06/14/2018  . Alcohol intake above recommended sensible limits 05/05/2017  . Polycythemia 05/04/2017  . Positive colorectal cancer screening using DNA-based stool test 07/13/2016  . Welcome to Medicare preventive visit 04/02/2016  . History of shingles 03/26/2016  . Routine general medical examination at a health care facility 06/08/2011  . HLD (hyperlipidemia) 12/16/2010  . Hypothyroidism 10/07/2010  . ALLERGIC RHINITIS CAUSE UNSPECIFIED 08/12/2010  . Essential hypertension 08/12/2010   Past Medical History:  Diagnosis Date  . Allergic rhinitis, cause unspecified   . Conversion disorder   . HLD (hyperlipidemia)    high LDL  . HTN (hypertension)   . Hypothyroid   . OA (osteoarthritis)    left wrist from fracture    Past Surgical History:  Procedure Laterality Date  . WRIST SURGERY   02/2008   right fracture   Social History   Tobacco Use  . Smoking status: Former Smoker    Last attempt to quit: 10/24/2005    Years since quitting: 12.7  . Smokeless tobacco: Never Used  Substance Use Topics  . Alcohol use: Yes    Alcohol/week: 0.0 standard drinks    Comment: 4-5 beers at night   . Drug use: No   Family History  Problem Relation Age of Onset  . Hypertension Mother   . Cancer Father        Lung and esophageal  . Hypertension Brother   . Hypertension Sister   . Hypothyroidism Sister    No Known Allergies Current Outpatient Medications on File Prior to Visit  Medication Sig Dispense Refill  . amLODipine (NORVASC) 5 MG tablet Take 1 tablet (5 mg total) by mouth daily. 90 tablet 3  . levothyroxine (SYNTHROID, LEVOTHROID) 88 MCG tablet Take 1 tablet (88 mcg total) by mouth daily. In am before breakfast 90 tablet 3   No current facility-administered medications on file prior to visit.      Review of Systems  Constitutional: Positive for appetite change and fatigue. Negative for fever.  HENT: Positive for congestion, postnasal drip, rhinorrhea, sinus pressure, sneezing and sore throat. Negative for ear discharge and ear pain.   Eyes: Negative for pain and discharge.  Respiratory: Positive for cough. Negative for  chest tightness, shortness of breath, wheezing and stridor.   Cardiovascular: Negative for chest pain.  Gastrointestinal: Positive for nausea. Negative for diarrhea and vomiting.  Genitourinary: Negative for frequency, hematuria and urgency.  Musculoskeletal: Negative for arthralgias and myalgias.  Skin: Negative for rash.  Neurological: Positive for headaches. Negative for dizziness, weakness and light-headedness.  Psychiatric/Behavioral: Negative for confusion and dysphoric mood.       Objective:   Physical Exam Constitutional:      General: She is not in acute distress.    Appearance: Normal appearance. She is well-developed and normal weight.  She is ill-appearing. She is not toxic-appearing or diaphoretic.     Comments: Febrile and fatigued   HENT:     Head: Normocephalic and atraumatic.     Comments: Nares are injected and congested    No sinus tenderness    Right Ear: Tympanic membrane, ear canal and external ear normal.     Left Ear: Tympanic membrane, ear canal and external ear normal.     Nose: Congestion and rhinorrhea present.     Mouth/Throat:     Mouth: Mucous membranes are moist.     Pharynx: Oropharynx is clear. No oropharyngeal exudate or posterior oropharyngeal erythema.     Comments: Clear pnd  Eyes:     General:        Right eye: No discharge.        Left eye: No discharge.     Conjunctiva/sclera: Conjunctivae normal.     Pupils: Pupils are equal, round, and reactive to light.  Neck:     Musculoskeletal: Normal range of motion and neck supple.  Cardiovascular:     Rate and Rhythm: Tachycardia present.     Heart sounds: Normal heart sounds. No murmur.  Pulmonary:     Effort: Pulmonary effort is normal. No respiratory distress.     Breath sounds: Normal breath sounds. No stridor. No wheezing, rhonchi or rales.     Comments: Good air exch No wheeze even on forced exp Some upper airway sounds  No rales/rhonchi Chest:     Chest wall: No tenderness.  Abdominal:     General: Abdomen is flat. Bowel sounds are normal. There is no distension.     Palpations: There is no mass.     Tenderness: There is no abdominal tenderness.  Musculoskeletal:     Right lower leg: No edema.     Left lower leg: No edema.  Lymphadenopathy:     Cervical: No cervical adenopathy.  Skin:    General: Skin is warm and dry.     Capillary Refill: Capillary refill takes less than 2 seconds.     Coloration: Skin is not pale.     Findings: No rash.  Neurological:     General: No focal deficit present.     Mental Status: She is alert.     Cranial Nerves: No cranial nerve deficit.  Psychiatric:        Mood and Affect: Mood  normal.           Assessment & Plan:   Problem List Items Addressed This Visit      Respiratory   Influenza - Primary    Despite neg flu test on Friday(suspect false neg) I strongly suspect influenza  Will px tamiflu due to severity of symptoms and to decrease transmission  Disc symptomatic care - see instructions on AVS   Fever control will help much Acetaminophen 1000 mg Q 4-6 h and ibuprofen 400 mg  Q 6-8 hours prn  Hycodan for cough with caution of sedation  Update if not starting to improve in a week or if worsening         Relevant Medications   oseltamivir (TAMIFLU) 75 MG capsule

## 2018-07-17 NOTE — Telephone Encounter (Signed)
I will see her then  

## 2018-07-17 NOTE — Patient Instructions (Signed)
For fever  1000 mg of acetaminophen every 4-6 hours   Also 400 mg of ibuprofen (a little food with it) every 6-8 hours   The two together should help a lot more   Take tamiflu as directed   If worse or short of breath get to the hospital  Fill px for cough syrup as needed for severe cough   Update if not starting to improve in a week or if worsening

## 2018-07-17 NOTE — Assessment & Plan Note (Signed)
Despite neg flu test on Friday(suspect false neg) I strongly suspect influenza  Will px tamiflu due to severity of symptoms and to decrease transmission  Disc symptomatic care - see instructions on AVS   Fever control will help much Acetaminophen 1000 mg Q 4-6 h and ibuprofen 400 mg Q 6-8 hours prn  Hycodan for cough with caution of sedation  Update if not starting to improve in a week or if worsening

## 2018-07-20 ENCOUNTER — Telehealth: Payer: Self-pay

## 2018-07-20 ENCOUNTER — Ambulatory Visit (INDEPENDENT_AMBULATORY_CARE_PROVIDER_SITE_OTHER)
Admission: RE | Admit: 2018-07-20 | Discharge: 2018-07-20 | Disposition: A | Discharge status: Home / Self Care | Payer: Medicare Other | Source: Ambulatory Visit | Attending: Family Medicine | Admitting: Family Medicine

## 2018-07-20 ENCOUNTER — Telehealth: Payer: Self-pay | Admitting: Family Medicine

## 2018-07-20 ENCOUNTER — Ambulatory Visit (INDEPENDENT_AMBULATORY_CARE_PROVIDER_SITE_OTHER): Payer: Medicare Other | Admitting: Family Medicine

## 2018-07-20 ENCOUNTER — Encounter: Payer: Self-pay | Admitting: Family Medicine

## 2018-07-20 VITALS — BP 150/68 | HR 98 | Temp 99.4°F | Ht 61.25 in | Wt 131.8 lb

## 2018-07-20 DIAGNOSIS — R059 Cough, unspecified: Secondary | ICD-10-CM

## 2018-07-20 DIAGNOSIS — J111 Influenza due to unidentified influenza virus with other respiratory manifestations: Secondary | ICD-10-CM

## 2018-07-20 DIAGNOSIS — J181 Lobar pneumonia, unspecified organism: Secondary | ICD-10-CM | POA: Diagnosis not present

## 2018-07-20 DIAGNOSIS — R05 Cough: Secondary | ICD-10-CM | POA: Diagnosis not present

## 2018-07-20 DIAGNOSIS — J069 Acute upper respiratory infection, unspecified: Secondary | ICD-10-CM | POA: Insufficient documentation

## 2018-07-20 DIAGNOSIS — J189 Pneumonia, unspecified organism: Secondary | ICD-10-CM

## 2018-07-20 MED ORDER — AZITHROMYCIN 250 MG PO TABS: ORAL_TABLET | ORAL | 0 refills | Status: DC

## 2018-07-20 MED ORDER — CEFUROXIME AXETIL 500 MG PO TABS: 500.0000 mg | ORAL_TABLET | Freq: Two times a day (BID) | ORAL | 0 refills | Status: DC

## 2018-07-20 NOTE — Progress Notes (Signed)
Subjective:    Patient ID: Jocelyn Payne, female    DOB: 1950/09/11, 67 y.o.   MRN: [id number]  HPI  Here for re check of flu symptoms  Wt Readings from Last 3 Encounters:  07/17/18 130 lb 4 oz (59.1 kg)  07/14/18 132 lb (59.9 kg)  06/14/18 129 lb 12 oz (58.9 kg)    Seen 12/23 for influenza tx with tamiflu   Taking tylenol  Temp 99.4 this am  Pulse ox 94% Pulse 98  Cannot take anymore advil  Taking tylenol since Friday   Temp 102.9 at 7 am - keeps coming back up  Lowest 99.9   More trouble breathing  Cough is improved - when she does it is non productive  Some wheezing   She is drinking a lot of fluids  No appetite    Cough med- helped/ made sleepy but could not sleep  No sinus pain  No ear pain  No ST  CXR report from today: Dg Chest 2 View  Result Date: 07/20/2018 CLINICAL DATA:  Influenza for 7 days with cough and fever. EXAM: CHEST - 2 VIEW COMPARISON:  None. FINDINGS: The heart size and mediastinal contours are within normal limits. There is an extensive and very dense pneumonia involving much of the right lower lobe. No associated pleural fluid, pulmonary edema or pneumothorax. The visualized skeletal structures are unremarkable. IMPRESSION: Extensive and dense right lower lobe pneumonia. Given density of the consolidation, follow-up chest x-ray would be appropriate after treatment. Followup PA and lateral chest X-ray is recommended in 3-4 weeks following trial of antibiotic therapy to ensure resolution and exclude underlying malignancy. Electronically Signed   By: Aletta Edouard M.D.   On: 07/20/2018 09:03    Patient Active Problem List   Diagnosis Date Noted  . Cough 07/20/2018  . Pneumonia 07/17/2018  . Medicare annual wellness visit, initial 06/14/2018  . Alcohol intake above recommended sensible limits 05/05/2017  . Polycythemia 05/04/2017  . Positive colorectal cancer screening using DNA-based stool test 07/13/2016  . Welcome to Medicare preventive  visit 04/02/2016  . History of shingles 03/26/2016  . Routine general medical examination at a health care facility 06/08/2011  . HLD (hyperlipidemia) 12/16/2010  . Hypothyroidism 10/07/2010  . ALLERGIC RHINITIS CAUSE UNSPECIFIED 08/12/2010  . Essential hypertension 08/12/2010   Past Medical History:  Diagnosis Date  . Allergic rhinitis, cause unspecified   . Conversion disorder   . HLD (hyperlipidemia)    high LDL  . HTN (hypertension)   . Hypothyroid   . OA (osteoarthritis)    left wrist from fracture    Past Surgical History:  Procedure Laterality Date  . WRIST SURGERY  02/2008   right fracture   Social History   Tobacco Use  . Smoking status: Former Smoker    Last attempt to quit: 10/24/2005    Years since quitting: 12.7  . Smokeless tobacco: Never Used  Substance Use Topics  . Alcohol use: Yes    Alcohol/week: 0.0 standard drinks    Comment: 4-5 beers at night   . Drug use: No   Family History  Problem Relation Age of Onset  . Hypertension Mother   . Cancer Father        Lung and esophageal  . Hypertension Brother   . Hypertension Sister   . Hypothyroidism Sister    No Known Allergies Current Outpatient Medications on File Prior to Visit  Medication Sig Dispense Refill  . amLODipine (NORVASC) 5 MG tablet Take  1 tablet (5 mg total) by mouth daily. 90 tablet 3  . HYDROcodone-homatropine (HYCODAN) 5-1.5 MG/5ML syrup Take 5 mLs by mouth every 8 (eight) hours as needed for cough. 80 mL 0  . levothyroxine (SYNTHROID, LEVOTHROID) 88 MCG tablet Take 1 tablet (88 mcg total) by mouth daily. In am before breakfast 90 tablet 3  . oseltamivir (TAMIFLU) 75 MG capsule Take 1 capsule (75 mg total) by mouth 2 (two) times daily. 10 capsule 0   No current facility-administered medications on file prior to visit.     Review of Systems  Constitutional: Positive for appetite change, chills, diaphoresis, fatigue and fever.  HENT: Positive for congestion, postnasal drip,  rhinorrhea and sore throat. Negative for ear pain, mouth sores, sinus pressure, sinus pain and sneezing.   Eyes: Negative for pain and discharge.  Respiratory: Positive for cough, chest tightness and wheezing. Negative for shortness of breath and stridor.   Cardiovascular: Negative for chest pain.  Gastrointestinal: Negative for diarrhea, nausea and vomiting.  Genitourinary: Negative for frequency, hematuria and urgency.  Musculoskeletal: Negative for arthralgias and myalgias.  Skin: Negative for rash.  Neurological: Positive for headaches. Negative for dizziness, weakness and light-headedness.  Psychiatric/Behavioral: Negative for confusion and dysphoric mood.       Objective:   Physical Exam Constitutional:      General: She is not in acute distress.    Appearance: Normal appearance. She is well-developed and normal weight. She is ill-appearing. She is not toxic-appearing or diaphoretic.     Comments: Fatigued    HENT:     Head: Normocephalic and atraumatic.     Comments: Nares are injected and congested   No sinus tenderness    Right Ear: Tympanic membrane, ear canal and external ear normal.     Left Ear: Tympanic membrane, ear canal and external ear normal.     Nose: Congestion and rhinorrhea present.     Mouth/Throat:     Mouth: Mucous membranes are moist.     Pharynx: Oropharynx is clear. No oropharyngeal exudate or posterior oropharyngeal erythema.     Comments: Clear pnd  Eyes:     General:        Right eye: No discharge.        Left eye: No discharge.     Conjunctiva/sclera: Conjunctivae normal.     Pupils: Pupils are equal, round, and reactive to light.  Neck:     Musculoskeletal: Normal range of motion and neck supple.  Cardiovascular:     Rate and Rhythm: Normal rate.     Heart sounds: Normal heart sounds.  Pulmonary:     Effort: Pulmonary effort is normal. No respiratory distress.     Breath sounds: No stridor. Rhonchi present. No wheezing or rales.      Comments: Few scattered rhonchi No rales  Harsh bs  No wheeze even on forced exp  Good air exch  Chest:     Chest wall: No tenderness.  Lymphadenopathy:     Cervical: No cervical adenopathy.  Skin:    General: Skin is warm and dry.     Capillary Refill: Capillary refill takes less than 2 seconds.     Findings: No rash.  Neurological:     Mental Status: She is alert.     Cranial Nerves: No cranial nerve deficit.  Psychiatric:        Mood and Affect: Mood normal.     Comments: Pleasant  Assessment & Plan:   Problem List Items Addressed This Visit      Respiratory   Pneumonia    CAP  With influenza -and continued fever Day 6-7- controlling temp with tylenol and taking tamiflu Some wheezing at home, none here Pulse ox 94% -watch for hypoxia  Cover with ceftin and azithromycin  Will plan f/u next week  Alert if no improvement (or worse) in next several days        Relevant Medications   azithromycin (ZITHROMAX Z-PAK) 250 MG tablet     Other   Cough - Primary   Relevant Orders   DG Chest 2 View (Completed)

## 2018-07-20 NOTE — Telephone Encounter (Signed)
Aware- phone note started

## 2018-07-20 NOTE — Telephone Encounter (Signed)
Diane from [facility] called with report on chest X-ray done. Report in Epic:  Extensive and dense right lower lobe pneumonia. Given density of the consolidation, follow-up chest x-ray would be appropriate after treatment.  Followup PA and lateral chest X-ray is recommended in 3-4 weeks following trial of antibiotic therapy to ensure resolution and exclude underlying malignancy.

## 2018-07-20 NOTE — Assessment & Plan Note (Addendum)
CAP  With influenza -and continued fever Day 6-7- controlling temp with tylenol and taking tamiflu Some wheezing at home, none here Pulse ox 94% -watch for hypoxia  Cover with ceftin and azithromycin  Will plan f/u next week  Alert if no improvement (or worse) in next several days

## 2018-07-20 NOTE — Patient Instructions (Signed)
Take the zpak as directed   Chest xray now - we will contact you with result   Continue tylenol/advil (with food) for fever control   Update if not starting to improve in a week or if worsening

## 2018-07-20 NOTE — Telephone Encounter (Signed)
Please let pt know she had a R sided pneumonia  Take the zpak I sent  I am also adding one more antibiotic to cover her - Ceftin  Take both  F/u Monday for a re check  If worse at any time - call and go to ER if after hours   I sent ceftin to her pharmacy

## 2018-07-20 NOTE — Telephone Encounter (Signed)
Left VM requesting pt to call the office back 

## 2018-07-20 NOTE — Telephone Encounter (Signed)
Pt notified of Dr. Marliss Coots comments and instructions and verbalized understanding. Pt will go get new abx and f/u appt scheduled for Monday

## 2018-07-21 ENCOUNTER — Telehealth: Payer: Self-pay | Admitting: *Deleted

## 2018-07-21 NOTE — Telephone Encounter (Signed)
Good, thanks  I will see her then

## 2018-07-21 NOTE — Telephone Encounter (Signed)
-----   Message from Abner Greenspan, MD sent at 07/20/2018  4:55 PM EST ----- Please call tomorrow to see how she is feeling and how she is tolerating the antibiotics Thanks

## 2018-07-21 NOTE — Telephone Encounter (Signed)
Pt said this is the 1st day she is starting to feel a little better. Pt said she started both abx yesterday and can tell a difference today. Pt advise that if sxs worsen over weekend or if she develops any new sxs to call on call doctor here and also go to ER. Also advised that even though she is feeling a little better today to still keep f/u appt scheduled for Monday 07/24/18 for a recheck of sxs. Pt verbalized understanding and thanked Dr. Glori Bickers for checking in on her.

## 2018-07-24 ENCOUNTER — Ambulatory Visit (INDEPENDENT_AMBULATORY_CARE_PROVIDER_SITE_OTHER): Payer: Medicare Other | Admitting: Family Medicine

## 2018-07-24 ENCOUNTER — Encounter: Payer: Self-pay | Admitting: Family Medicine

## 2018-07-24 VITALS — BP 142/80 | HR 94 | Temp 98.2°F | Ht 61.25 in | Wt 131.5 lb

## 2018-07-24 DIAGNOSIS — L309 Dermatitis, unspecified: Secondary | ICD-10-CM | POA: Diagnosis not present

## 2018-07-24 DIAGNOSIS — I10 Essential (primary) hypertension: Secondary | ICD-10-CM | POA: Diagnosis not present

## 2018-07-24 DIAGNOSIS — J181 Lobar pneumonia, unspecified organism: Secondary | ICD-10-CM | POA: Diagnosis not present

## 2018-07-24 DIAGNOSIS — J189 Pneumonia, unspecified organism: Secondary | ICD-10-CM

## 2018-07-24 NOTE — Assessment & Plan Note (Signed)
Greatly improved clinically  Some scant rales R base on exam  She will finish ceftin (done with zpak and tamiflu) Gradual return to reg activity  Fluids / regular meals F/u 2-3 wk for re check CXR (large RLL infiltrate)  Update if not continuing to improve in a week or if worsening

## 2018-07-24 NOTE — Patient Instructions (Addendum)
I'm so glad you are feeling better Finish the ceftin  Drink fluids Start eating when you feel like it  Gradually increase your activity as tolerated  Update if not continuing to improve in a week or if worsening    Follow up in 2-3 weeks    For dermatitis on your back - use dove soap and keep moisturized as well  If this worsens let us know

## 2018-07-24 NOTE — Assessment & Plan Note (Signed)
bp up in the setting of illness and white coat syndrome Much improved on 2nd check BP: (!) 142/80    F/u 2-3 wk for re check

## 2018-07-24 NOTE — Progress Notes (Signed)
Subjective:    Patient ID: Jocelyn Payne, female    DOB: [birth year], 67 y.o.   MRN: [id number]  HPI  Here for f/u for pneumonia (that started with influenza) RLL large infiltrate   Dg Chest 2 View  Result Date: 07/20/2018 CLINICAL DATA:  Influenza for 7 days with cough and fever. EXAM: CHEST - 2 VIEW COMPARISON:  None. FINDINGS: The heart size and mediastinal contours are within normal limits. There is an extensive and very dense pneumonia involving much of the right lower lobe. No associated pleural fluid, pulmonary edema or pneumothorax. The visualized skeletal structures are unremarkable. IMPRESSION: Extensive and dense right lower lobe pneumonia. Given density of the consolidation, follow-up chest x-ray would be appropriate after treatment. Followup PA and lateral chest X-ray is recommended in 3-4 weeks following trial of antibiotic therapy to ensure resolution and exclude underlying malignancy. Electronically Signed   By: Aletta Edouard M.D.   On: 07/20/2018 09:03     Taking ceftin / finished zpak Finished tamiflu Hycodan cough med   Feeling much much better  Not coughing  No more fever  Last temp was 99 and none today   bp is still high today Just took her medicine at 8 pm  BP Readings from Last 3 Encounters:  07/24/18 (!) 170/80  07/20/18 (!) 150/68  07/17/18 (!) 176/48  much better on 2nd check BP: (!) 142/80     Amlodipine 5 mg  Pulse Readings from Last 3 Encounters:  07/24/18 94  07/20/18 98  07/17/18 (!) 121    Pulse ox is 95% on RA  Back -rash  Feels dry  Itches -since last Thursday   Patient Active Problem List   Diagnosis Date Noted  . Dermatitis 07/24/2018  . Cough 07/20/2018  . Pneumonia 07/17/2018  . Medicare annual wellness visit, initial 06/14/2018  . Alcohol intake above recommended sensible limits 05/05/2017  . Polycythemia 05/04/2017  . Positive colorectal cancer screening using DNA-based stool test 07/13/2016  . Welcome to Medicare  preventive visit 04/02/2016  . History of shingles 03/26/2016  . Routine general medical examination at a health care facility 06/08/2011  . HLD (hyperlipidemia) 12/16/2010  . Hypothyroidism 10/07/2010  . ALLERGIC RHINITIS CAUSE UNSPECIFIED 08/12/2010  . Essential hypertension 08/12/2010   Past Medical History:  Diagnosis Date  . Allergic rhinitis, cause unspecified   . Conversion disorder   . HLD (hyperlipidemia)    high LDL  . HTN (hypertension)   . Hypothyroid   . OA (osteoarthritis)    left wrist from fracture    Past Surgical History:  Procedure Laterality Date  . WRIST SURGERY  02/2008   right fracture   Social History   Tobacco Use  . Smoking status: Former Smoker    Last attempt to quit: 10/24/2005    Years since quitting: 12.7  . Smokeless tobacco: Never Used  Substance Use Topics  . Alcohol use: Yes    Alcohol/week: 0.0 standard drinks    Comment: 4-5 beers at night   . Drug use: No   Family History  Problem Relation Age of Onset  . Hypertension Mother   . Cancer Father        Lung and esophageal  . Hypertension Brother   . Hypertension Sister   . Hypothyroidism Sister    No Known Allergies Current Outpatient Medications on File Prior to Visit  Medication Sig Dispense Refill  . amLODipine (NORVASC) 5 MG tablet Take 1 tablet (5 mg total) by mouth daily.  90 tablet 3  . cefUROXime (CEFTIN) 500 MG tablet Take 1 tablet (500 mg total) by mouth 2 (two) times daily with a meal. 20 tablet 0  . HYDROcodone-homatropine (HYCODAN) 5-1.5 MG/5ML syrup Take 5 mLs by mouth every 8 (eight) hours as needed for cough. 80 mL 0  . levothyroxine (SYNTHROID, LEVOTHROID) 88 MCG tablet Take 1 tablet (88 mcg total) by mouth daily. In am before breakfast 90 tablet 3   No current facility-administered medications on file prior to visit.     Review of Systems  Constitutional: Positive for fatigue. Negative for activity change, appetite change, chills, diaphoresis, fever and  unexpected weight change.       Fever is resolved   HENT: Positive for postnasal drip. Negative for congestion, ear pain, rhinorrhea, sinus pressure and sore throat.   Eyes: Negative for pain, redness and visual disturbance.  Respiratory: Positive for cough. Negative for chest tightness, shortness of breath and wheezing.        Cough is much improved  Cardiovascular: Negative for chest pain and palpitations.  Gastrointestinal: Negative for abdominal pain, blood in stool, constipation and diarrhea.  Endocrine: Negative for polydipsia and polyuria.  Genitourinary: Negative for dysuria, frequency and urgency.  Musculoskeletal: Negative for arthralgias, back pain and myalgias.  Skin: Positive for rash. Negative for pallor.  Allergic/Immunologic: Negative for environmental allergies.  Neurological: Negative for dizziness, syncope and headaches.  Hematological: Negative for adenopathy. Does not bruise/bleed easily.  Psychiatric/Behavioral: Negative for decreased concentration and dysphoric mood. The patient is not nervous/anxious.        Objective:   Physical Exam Constitutional:      General: She is not in acute distress.    Appearance: Normal appearance. She is well-developed and normal weight. She is not ill-appearing or diaphoretic.     Comments: Much improved   HENT:     Head: Normocephalic and atraumatic.     Nose: No rhinorrhea.     Mouth/Throat:     Mouth: Mucous membranes are moist.     Pharynx: Oropharynx is clear. No posterior oropharyngeal erythema.  Eyes:     General:        Right eye: No discharge.        Left eye: No discharge.     Conjunctiva/sclera: Conjunctivae normal.     Pupils: Pupils are equal, round, and reactive to light.  Neck:     Musculoskeletal: Normal range of motion and neck supple. No neck rigidity.     Thyroid: No thyromegaly.     Vascular: No carotid bruit or JVD.  Cardiovascular:     Rate and Rhythm: Regular rhythm. Tachycardia present.     Heart  sounds: Normal heart sounds. No gallop.   Pulmonary:     Effort: Pulmonary effort is normal. No respiratory distress.     Breath sounds: Normal breath sounds. No wheezing or rales.  Abdominal:     General: Bowel sounds are normal. There is no distension or abdominal bruit.     Palpations: Abdomen is soft. There is no mass.     Tenderness: There is no abdominal tenderness.  Lymphadenopathy:     Cervical: No cervical adenopathy.  Skin:    General: Skin is warm and dry.     Capillary Refill: Capillary refill takes less than 2 seconds.     Coloration: Skin is not pale.     Findings: No erythema or rash.     Comments: Dermatitis with dry skin and papules over back (worse on  low back)  Few excoriations No vesicles  No signs of bacterial infection   Neurological:     General: No focal deficit present.     Mental Status: She is alert.     Cranial Nerves: No cranial nerve deficit.     Deep Tendon Reflexes: Reflexes are normal and symmetric.  Psychiatric:        Mood and Affect: Mood normal.           Assessment & Plan:   Problem List Items Addressed This Visit      Cardiovascular and Mediastinum   Essential hypertension    bp up in the setting of illness and white coat syndrome Much improved on 2nd check BP: (!) 142/80    F/u 2-3 wk for re check        Respiratory   Pneumonia - Primary    Greatly improved clinically  Some scant rales R base on exam  She will finish ceftin (done with zpak and tamiflu) Gradual return to reg activity  Fluids / regular meals F/u 2-3 wk for re check CXR (large RLL infiltrate)  Update if not continuing to improve in a week or if worsening          Musculoskeletal and Integument   Dermatitis    With some excoriations on back  May be from sweating/ recent illness  Improved with gentle cleansing (dove) and aquaph Does not resemble shingles inst to alert [place] if worse or signs of infection

## 2018-07-24 NOTE — Assessment & Plan Note (Signed)
With some excoriations on back  May be from sweating/ recent illness  Improved with gentle cleansing (dove) and aquaph Does not resemble shingles inst to alert [place] if worse or signs of infection

## 2018-08-04 ENCOUNTER — Ambulatory Visit (INDEPENDENT_AMBULATORY_CARE_PROVIDER_SITE_OTHER): Payer: Medicare Other | Admitting: Family Medicine

## 2018-08-04 ENCOUNTER — Telehealth: Payer: Self-pay

## 2018-08-04 ENCOUNTER — Encounter: Payer: Self-pay | Admitting: Family Medicine

## 2018-08-04 VITALS — BP 150/74 | HR 94 | Temp 97.8°F | Ht 61.25 in | Wt 127.8 lb

## 2018-08-04 DIAGNOSIS — J069 Acute upper respiratory infection, unspecified: Secondary | ICD-10-CM

## 2018-08-04 NOTE — Telephone Encounter (Signed)
Seen today. 

## 2018-08-04 NOTE — Telephone Encounter (Signed)
Pt last seen 07/25/19 and finished abx on 07/29/18; pt has fever 99.3. I called pt back for more info on symptoms and pt said she has appt to see Dr Darnell Level at 12:15 today and pt has to leave to make appt time. Unable to get additional info to triage call. FYI to Dr Darnell Level.

## 2018-08-04 NOTE — Patient Instructions (Addendum)
Lungs sound clear. Possible viral infection, possible RSV.  Supportive care - fluids, rest, delsym, honey with lemon Should improving over next 7-10 days.  Do keep appointment with Dr Glori Bickers next week.

## 2018-08-04 NOTE — Assessment & Plan Note (Signed)
Anticipate viral URI - possible RSV given sick exposures at home. Not consistent with recurrent pneumonia infection. Supportive care reviewed - rec OTC delsym or robitussin PRN cough. Keep f/u with PCP next week.

## 2018-08-04 NOTE — Progress Notes (Signed)
BP (!) 150/74 (BP Location: Left Arm, Patient Position: Sitting, Cuff Size: Normal)   Pulse 94   Temp 97.8 F (36.6 C) (Oral)   Ht 5' 1.25" (1.556 m)   Wt 127 lb 12 oz (57.9 kg)   SpO2 98%   BMI 23.94 kg/m   Was rushing to get to office today  CC: nasal congestion Subjective:    Patient ID: Jocelyn Payne, female    DOB: August 25, 1950, 68 y.o.   MRN: [id number]  HPI: Jocelyn Payne is a 68 y.o. female presenting on 08/04/2018 for Nasal Congestion (C/o nasal congestion and cough. States she just finished abx for pneumonia. Told to come in if any new sxs. Pt is concerned due to helping care for infant grandchildren. )   Recent influenza complicated by pneumonia (RLL infiltrate on CXR) treated with ceftin and zpack and tamiflu. Will need f/u CXR to ensure resolution - has f/u scheduled with PCP next week.  Last antibiotic pill was Saturday. This week felt back to normal except for fatigue.  Then this morning she noted increased congestion, rhinorrhea, blowing nose with clear mucous, minimal cough. Tmax at home 99.3.   No ear or tooth pain, ST, PNdrainage, dyspnea or wheezing, HA.  Hasn't tried anything for this.  Did not tolerated hydrocodone cough medicine.  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      Relevant past medical, surgical, family and social history reviewed and updated as indicated. Interim medical history since our last visit reviewed. Allergies and medications reviewed and updated. Outpatient Medications Prior to Visit  Medication Sig Dispense Refill  . amLODipine (NORVASC) 5 MG tablet Take 1 tablet (5 mg total) by mouth daily. 90 tablet 3  . levothyroxine (SYNTHROID, LEVOTHROID) 88 MCG tablet Take 1 tablet (88 mcg total) by mouth daily. In am before breakfast 90 tablet 3  . cefUROXime (CEFTIN) 500 MG tablet Take 1 tablet (500 mg total) by mouth 2 (two) times daily with a meal. 20 tablet 0  . HYDROcodone-homatropine (HYCODAN) 5-1.5 MG/5ML syrup Take 5  mLs by mouth every 8 (eight) hours as needed for cough. 80 mL 0   No facility-administered medications prior to visit.      Per HPI unless specifically indicated in ROS section below Review of Systems Objective:    BP (!) 150/74 (BP Location: Left Arm, Patient Position: Sitting, Cuff Size: Normal)   Pulse 94   Temp 97.8 F (36.6 C) (Oral)   Ht 5' 1.25" (1.556 m)   Wt 127 lb 12 oz (57.9 kg)   SpO2 98%   BMI 23.94 kg/m   Wt Readings from Last 3 Encounters:  08/04/18 127 lb 12 oz (57.9 kg)  07/24/18 131 lb 8 oz (59.6 kg)  07/20/18 131 lb 12 oz (59.8 kg)    Physical Exam Vitals signs and nursing note reviewed.  Constitutional:      General: She is not in acute distress.    Appearance: She is well-developed.  HENT:     Head: Normocephalic and atraumatic.     Right Ear: Hearing, tympanic membrane, ear canal and external ear normal.     Left Ear: Hearing, tympanic membrane, ear canal and external ear normal.     Nose: Congestion (mild) and rhinorrhea present. No mucosal edema.     Right Sinus: No maxillary sinus tenderness or frontal sinus tenderness.     Left Sinus: No maxillary sinus tenderness or frontal sinus tenderness.     Mouth/Throat:  Mouth: Mucous membranes are moist.     Pharynx: Oropharynx is clear. Uvula midline. Posterior oropharyngeal erythema present. No oropharyngeal exudate.     Tonsils: No tonsillar abscesses.  Eyes:     General: No scleral icterus.    Conjunctiva/sclera: Conjunctivae normal.     Pupils: Pupils are equal, round, and reactive to light.  Neck:     Musculoskeletal: Normal range of motion and neck supple.  Cardiovascular:     Rate and Rhythm: Normal rate and regular rhythm.     Heart sounds: Normal heart sounds. No murmur.  Pulmonary:     Effort: Pulmonary effort is normal. No respiratory distress.     Breath sounds: Normal breath sounds. No wheezing, rhonchi or rales.     Comments: Lungs largely clear Lymphadenopathy:     Cervical: No  cervical adenopathy.  Skin:    General: Skin is warm and dry.     Findings: No rash.       DG Chest 2 View CLINICAL DATA:  Influenza for 7 days with cough and fever.  EXAM: CHEST - 2 VIEW  COMPARISON:  None.  FINDINGS: The heart size and mediastinal contours are within normal limits. There is an extensive and very dense pneumonia involving much of the right lower lobe. No associated pleural fluid, pulmonary edema or pneumothorax. The visualized skeletal structures are unremarkable.  IMPRESSION: Extensive and dense right lower lobe pneumonia. Given density of the consolidation, follow-up chest x-ray would be appropriate after treatment.  Followup PA and lateral chest X-ray is recommended in 3-4 weeks following trial of antibiotic therapy to ensure resolution and exclude underlying malignancy.  Electronically Signed   By: Aletta Edouard M.D.   On: 07/20/2018 09:03   Assessment & Plan:   Problem List Items Addressed This Visit    URI, acute - Primary    Anticipate viral URI - possible RSV given sick exposures at home. Not consistent with recurrent pneumonia infection. Supportive care reviewed - rec OTC delsym or robitussin PRN cough. Keep f/u with PCP next week.           No orders of the defined types were placed in this encounter.  No orders of the defined types were placed in this encounter.  Patient Instructions  Lungs sound clear. Possible viral infection, possible RSV.  Supportive care - fluids, rest, delsym, honey with lemon Should improving over next 7-10 days.  Do keep appointment with Dr Glori Bickers next week.     Follow up plan: Return if symptoms worsen or fail to improve.  Ria Bush, MD

## 2018-08-09 ENCOUNTER — Ambulatory Visit: Payer: Medicare Other | Admitting: Family Medicine

## 2018-08-09 DIAGNOSIS — H25813 Combined forms of age-related cataract, bilateral: Secondary | ICD-10-CM | POA: Diagnosis not present

## 2018-08-10 ENCOUNTER — Telehealth: Payer: Self-pay

## 2018-08-10 NOTE — Telephone Encounter (Signed)
[facility] faxed not pt returning call.

## 2018-08-10 NOTE — Telephone Encounter (Signed)
I didn't call pt 

## 2018-08-16 ENCOUNTER — Encounter: Payer: Self-pay | Admitting: Family Medicine

## 2018-08-16 ENCOUNTER — Ambulatory Visit (INDEPENDENT_AMBULATORY_CARE_PROVIDER_SITE_OTHER): Payer: Medicare Other | Admitting: Family Medicine

## 2018-08-16 ENCOUNTER — Ambulatory Visit (INDEPENDENT_AMBULATORY_CARE_PROVIDER_SITE_OTHER)
Admission: RE | Admit: 2018-08-16 | Discharge: 2018-08-16 | Disposition: A | Discharge status: Home / Self Care | Payer: Medicare Other | Source: Ambulatory Visit | Attending: Family Medicine | Admitting: Family Medicine

## 2018-08-16 VITALS — BP 154/78 | HR 80 | Temp 97.7°F | Ht 61.25 in | Wt 129.8 lb

## 2018-08-16 DIAGNOSIS — J189 Pneumonia, unspecified organism: Secondary | ICD-10-CM

## 2018-08-16 DIAGNOSIS — J181 Lobar pneumonia, unspecified organism: Secondary | ICD-10-CM | POA: Diagnosis not present

## 2018-08-16 DIAGNOSIS — I10 Essential (primary) hypertension: Secondary | ICD-10-CM

## 2018-08-16 MED ORDER — AMLODIPINE BESYLATE 10 MG PO TABS: 10.0000 mg | ORAL_TABLET | Freq: Every day | ORAL | 3 refills | Status: DC

## 2018-08-16 NOTE — Assessment & Plan Note (Signed)
bp is still not at goal BP: (!) 154/78    Will inc amlodipine to 10 mg daily  Disc lifestyle habits  F/u 3 mo

## 2018-08-16 NOTE — Progress Notes (Signed)
Subjective:    Patient ID: Jocelyn Payne, female    DOB: 05/01/1951, 68 y.o.   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 EXAM: CHEST - 2 VIEW COMPARISON:  None. FINDINGS: The heart size and mediastinal contours are within normal limits. There is an extensive and very dense pneumonia involving much of the right lower lobe. No associated pleural fluid, pulmonary edema or pneumothorax. The visualized skeletal structures are unremarkable. IMPRESSION: Extensive and dense right lower lobe pneumonia. Given density of the consolidation, follow-up chest x-ray would be appropriate after treatment. Followup PA and lateral chest X-ray is recommended in 3-4 weeks following trial of antibiotic therapy to ensure resolution and exclude underlying malignancy. Electronically Signed   By: Aletta Edouard M.D.   On: 07/20/2018 09:03   Treated with ceftin and zpak  Due for cxr re check   Temp: 97.7 F (36.5 C)   bp was high last time BP Readings from Last 3 Encounters:  08/16/18 (!) 154/78  08/04/18 (!) 150/74  07/24/18 (!) 142/80   Still up-not as much  Amlodipine 5 mg   Patient Active Problem List   Diagnosis Date Noted  . Dermatitis 07/24/2018  . URI, acute 07/20/2018  . Pneumonia 07/17/2018  . Medicare annual wellness visit, initial 06/14/2018  . Alcohol intake above recommended sensible limits 05/05/2017  . Polycythemia 05/04/2017  . Positive colorectal cancer screening using DNA-based stool test 07/13/2016  . Welcome to Medicare preventive visit 04/02/2016  . History of shingles 03/26/2016  .  Routine general medical examination at a health care facility 06/08/2011  . HLD (hyperlipidemia) 12/16/2010  . Hypothyroidism 10/07/2010  . ALLERGIC RHINITIS CAUSE UNSPECIFIED 08/12/2010  . Essential hypertension 08/12/2010   Past Medical History:  Diagnosis Date  . Allergic rhinitis, cause unspecified   . Conversion disorder   . HLD (hyperlipidemia)    high LDL  . HTN (hypertension)   . Hypothyroid   . OA (osteoarthritis)    left wrist from fracture    Past Surgical History:  Procedure Laterality Date  . WRIST SURGERY  02/2008   right fracture   Social History   Tobacco Use  . Smoking status: Former Smoker    Last attempt to quit: 10/24/2005    Years since quitting: 12.8  . Smokeless tobacco: Never Used  Substance Use Topics  . Alcohol use: Yes    Alcohol/week: 0.0 standard drinks    Comment: 4-5 beers at night   . Drug use: No   Family History  Problem Relation Age of Onset  . Hypertension Mother   . Cancer Father        Lung and esophageal  . Hypertension Brother   . Hypertension Sister   . Hypothyroidism Sister    No Known Allergies Current Outpatient Medications on File Prior to Visit  Medication Sig Dispense Refill  . levothyroxine (SYNTHROID, LEVOTHROID) 88 MCG tablet Take 1 tablet (88 mcg total) by mouth daily. In am before breakfast 90 tablet 3   No current  facility-administered medications on file prior to visit.     Review of Systems  Constitutional: Negative for activity change, appetite change, fatigue, fever and unexpected weight change.  HENT: Negative for congestion, ear pain, rhinorrhea, sinus pressure and sore throat.   Eyes: Negative for pain, redness and visual disturbance.  Respiratory: Negative for cough, shortness of breath and wheezing.   Cardiovascular: Negative for chest pain and palpitations.  Gastrointestinal: Negative for abdominal pain, blood in stool, constipation and diarrhea.  Endocrine: Negative for polydipsia and polyuria.    Genitourinary: Negative for dysuria, frequency and urgency.  Musculoskeletal: Negative for arthralgias, back pain and myalgias.  Skin: Negative for pallor and rash.  Allergic/Immunologic: Negative for environmental allergies.  Neurological: Negative for dizziness, syncope and headaches.  Hematological: Negative for adenopathy. Does not bruise/bleed easily.  Psychiatric/Behavioral: Negative for decreased concentration and dysphoric mood. The patient is not nervous/anxious.        Objective:   Physical Exam Constitutional:      General: She is not in acute distress.    Appearance: Normal appearance. She is normal weight. She is not ill-appearing.  HENT:     Head: Normocephalic and atraumatic.     Right Ear: Tympanic membrane and ear canal normal.     Left Ear: Tympanic membrane and ear canal normal.     Nose: Nose normal.     Mouth/Throat:     Mouth: Mucous membranes are moist.     Pharynx: Oropharynx is clear.  Eyes:     General: No scleral icterus.    Conjunctiva/sclera: Conjunctivae normal.     Pupils: Pupils are equal, round, and reactive to light.  Neck:     Musculoskeletal: Normal range of motion.  Cardiovascular:     Rate and Rhythm: Normal rate and regular rhythm.     Pulses: Normal pulses.     Heart sounds: Normal heart sounds.  Pulmonary:     Effort: Pulmonary effort is normal. No respiratory distress.     Breath sounds: Normal breath sounds. No stridor. No wheezing, rhonchi or rales.  Musculoskeletal:     Right lower leg: No edema.     Left lower leg: No edema.  Lymphadenopathy:     Cervical: No cervical adenopathy.  Skin:    General: Skin is dry.     Capillary Refill: Capillary refill takes less than 2 seconds.     Findings: No rash.     Comments: rosacea  Neurological:     General: No focal deficit present.     Mental Status: She is alert.  Psychiatric:        Mood and Affect: Mood normal.           Assessment & Plan:   Problem List Items  Addressed This Visit      Cardiovascular and Mediastinum   Essential hypertension - Primary    bp is still not at goal BP: (!) 154/78    Will inc amlodipine to 10 mg daily  Disc lifestyle habits  F/u 3 mo       Relevant Medications   amLODipine (NORVASC) 10 MG tablet     Respiratory   Pneumonia    Clinically improved/resolved Reassuring exam Re check cxr today  Plan to follow rad rev      Relevant Orders   DG Chest 2 View

## 2018-08-16 NOTE — Patient Instructions (Addendum)
Increase your amlodipine to 10 mg daily   If any problems or side effects let us know  Follow up in about 3 months   Take care of yourself   Chest xray today  We will call you with a report

## 2018-08-16 NOTE — Assessment & Plan Note (Signed)
Clinically improved/resolved Reassuring exam Re check cxr today  Plan to follow rad rev

## 2018-09-12 DIAGNOSIS — H43811 Vitreous degeneration, right eye: Secondary | ICD-10-CM | POA: Diagnosis not present

## 2018-09-12 DIAGNOSIS — H35372 Puckering of macula, left eye: Secondary | ICD-10-CM | POA: Diagnosis not present

## 2018-09-12 DIAGNOSIS — H2513 Age-related nuclear cataract, bilateral: Secondary | ICD-10-CM | POA: Diagnosis not present

## 2018-09-12 DIAGNOSIS — D3131 Benign neoplasm of right choroid: Secondary | ICD-10-CM | POA: Diagnosis not present

## 2018-10-04 DIAGNOSIS — H2513 Age-related nuclear cataract, bilateral: Secondary | ICD-10-CM | POA: Diagnosis not present

## 2018-10-04 DIAGNOSIS — H5213 Myopia, bilateral: Secondary | ICD-10-CM | POA: Diagnosis not present

## 2018-11-24 ENCOUNTER — Telehealth: Payer: Self-pay

## 2018-11-24 ENCOUNTER — Other Ambulatory Visit: Payer: Self-pay | Admitting: Family Medicine

## 2018-11-24 ENCOUNTER — Other Ambulatory Visit: Payer: Self-pay

## 2018-11-24 MED ORDER — LEVOTHYROXINE SODIUM 88 MCG PO TABS: 88.0000 ug | ORAL_TABLET | Freq: Every day | ORAL | 3 refills | Status: DC

## 2018-11-24 NOTE — Telephone Encounter (Signed)
Pt left v/m that her pharmacy had requested refill levothyroxine 2 days ago and if pt has to wait 72 hrs for cb she will be out of med. Pt request cb today.

## 2018-11-24 NOTE — Telephone Encounter (Signed)
Called and spoke with pt to advise that she should have refills at [facility] pt stated that she has now switched pharmacy and is now at [facility] has been sent to [facility] as request and pt is aware.

## 2018-12-28 DIAGNOSIS — H52221 Regular astigmatism, right eye: Secondary | ICD-10-CM | POA: Diagnosis not present

## 2018-12-28 DIAGNOSIS — Z961 Presence of intraocular lens: Secondary | ICD-10-CM | POA: Diagnosis not present

## 2018-12-28 DIAGNOSIS — H5201 Hypermetropia, right eye: Secondary | ICD-10-CM | POA: Diagnosis not present

## 2018-12-28 DIAGNOSIS — H2511 Age-related nuclear cataract, right eye: Secondary | ICD-10-CM | POA: Diagnosis not present

## 2019-02-11 DIAGNOSIS — H2512 Age-related nuclear cataract, left eye: Secondary | ICD-10-CM | POA: Diagnosis not present

## 2019-02-15 DIAGNOSIS — H5203 Hypermetropia, bilateral: Secondary | ICD-10-CM | POA: Diagnosis not present

## 2019-02-15 DIAGNOSIS — Z961 Presence of intraocular lens: Secondary | ICD-10-CM | POA: Diagnosis not present

## 2019-02-15 DIAGNOSIS — H52221 Regular astigmatism, right eye: Secondary | ICD-10-CM | POA: Diagnosis not present

## 2019-02-15 DIAGNOSIS — H524 Presbyopia: Secondary | ICD-10-CM | POA: Diagnosis not present

## 2019-02-15 DIAGNOSIS — H2512 Age-related nuclear cataract, left eye: Secondary | ICD-10-CM | POA: Diagnosis not present

## 2019-03-29 DIAGNOSIS — M94261 Chondromalacia, right knee: Secondary | ICD-10-CM | POA: Diagnosis not present

## 2019-03-29 DIAGNOSIS — M25461 Effusion, right knee: Secondary | ICD-10-CM | POA: Diagnosis not present

## 2019-05-10 ENCOUNTER — Ambulatory Visit (INDEPENDENT_AMBULATORY_CARE_PROVIDER_SITE_OTHER): Payer: Medicare Other

## 2019-05-10 DIAGNOSIS — Z23 Encounter for immunization: Secondary | ICD-10-CM

## 2019-05-14 DIAGNOSIS — M25561 Pain in right knee: Secondary | ICD-10-CM | POA: Diagnosis not present

## 2019-05-14 DIAGNOSIS — M1710 Unilateral primary osteoarthritis, unspecified knee: Secondary | ICD-10-CM | POA: Diagnosis not present

## 2019-05-23 DIAGNOSIS — L570 Actinic keratosis: Secondary | ICD-10-CM | POA: Diagnosis not present

## 2019-05-23 DIAGNOSIS — L821 Other seborrheic keratosis: Secondary | ICD-10-CM | POA: Diagnosis not present

## 2019-05-23 DIAGNOSIS — L718 Other rosacea: Secondary | ICD-10-CM | POA: Diagnosis not present

## 2019-05-23 DIAGNOSIS — D692 Other nonthrombocytopenic purpura: Secondary | ICD-10-CM | POA: Diagnosis not present

## 2019-05-23 DIAGNOSIS — Z85828 Personal history of other malignant neoplasm of skin: Secondary | ICD-10-CM | POA: Diagnosis not present

## 2019-05-23 DIAGNOSIS — L814 Other melanin hyperpigmentation: Secondary | ICD-10-CM | POA: Diagnosis not present

## 2019-05-23 DIAGNOSIS — L84 Corns and callosities: Secondary | ICD-10-CM | POA: Diagnosis not present

## 2019-06-20 ENCOUNTER — Other Ambulatory Visit: Payer: Self-pay

## 2019-08-02 DIAGNOSIS — M25561 Pain in right knee: Secondary | ICD-10-CM | POA: Diagnosis not present

## 2019-08-02 DIAGNOSIS — M1710 Unilateral primary osteoarthritis, unspecified knee: Secondary | ICD-10-CM | POA: Diagnosis not present

## 2019-08-02 DIAGNOSIS — M94261 Chondromalacia, right knee: Secondary | ICD-10-CM | POA: Diagnosis not present

## 2019-08-22 ENCOUNTER — Other Ambulatory Visit: Payer: Self-pay

## 2019-08-22 NOTE — Telephone Encounter (Signed)
Patient needs an appointment last visit was in January 2020

## 2019-08-23 MED ORDER — LEVOTHYROXINE SODIUM 88 MCG PO TABS: 88.0000 ug | ORAL_TABLET | Freq: Every day | ORAL | 0 refills | Status: DC

## 2019-08-23 NOTE — Telephone Encounter (Signed)
Med refilled once  

## 2019-08-23 NOTE — Telephone Encounter (Signed)
Patient scheduled f/u appointment with Dr.Tower on 09/12/19.  Patient's requesting medication be refilled until her appointment.

## 2019-09-04 DIAGNOSIS — I10 Essential (primary) hypertension: Secondary | ICD-10-CM | POA: Diagnosis not present

## 2019-09-04 DIAGNOSIS — H524 Presbyopia: Secondary | ICD-10-CM | POA: Diagnosis not present

## 2019-09-04 DIAGNOSIS — Z961 Presence of intraocular lens: Secondary | ICD-10-CM | POA: Diagnosis not present

## 2019-09-04 DIAGNOSIS — H52221 Regular astigmatism, right eye: Secondary | ICD-10-CM | POA: Diagnosis not present

## 2019-09-04 DIAGNOSIS — H5203 Hypermetropia, bilateral: Secondary | ICD-10-CM | POA: Diagnosis not present

## 2019-09-06 ENCOUNTER — Ambulatory Visit: Payer: Medicare Other | Attending: Internal Medicine

## 2019-09-06 DIAGNOSIS — Z23 Encounter for immunization: Secondary | ICD-10-CM | POA: Insufficient documentation

## 2019-09-06 NOTE — Progress Notes (Signed)
   Covid-19 Vaccination Clinic  Name:  Jocelyn Payne    MRN: [id number] DOB: [birth year]  09/06/2019  [name] was observed post Covid-19 immunization for 15 minutes without incidence. She was provided with Vaccine Information Sheet and instruction to access the V-Safe system.   [name] was instructed to call 911 with any severe reactions post vaccine: Marland Kitchen Difficulty breathing  . Swelling of your face and throat  . A fast heartbeat  . A bad rash all over your body  . Dizziness and weakness    Immunizations Administered    Name Date Dose VIS Date Route   Pfizer COVID-19 Vaccine 09/06/2019 11:23 AM 0.3 mL 07/06/2019 Intramuscular   Manufacturer: [place]   Lot: [id number]   [place]: [id number]

## 2019-09-12 ENCOUNTER — Other Ambulatory Visit: Payer: Self-pay

## 2019-09-12 ENCOUNTER — Encounter: Payer: Self-pay | Admitting: Family Medicine

## 2019-09-12 ENCOUNTER — Ambulatory Visit (INDEPENDENT_AMBULATORY_CARE_PROVIDER_SITE_OTHER): Payer: Medicare Other | Admitting: Family Medicine

## 2019-09-12 VITALS — BP 140/72 | HR 85 | Temp 98.2°F | Ht 61.25 in | Wt 136.2 lb

## 2019-09-12 DIAGNOSIS — E039 Hypothyroidism, unspecified: Secondary | ICD-10-CM

## 2019-09-12 DIAGNOSIS — D751 Secondary polycythemia: Secondary | ICD-10-CM | POA: Diagnosis not present

## 2019-09-12 DIAGNOSIS — E7800 Pure hypercholesterolemia, unspecified: Secondary | ICD-10-CM

## 2019-09-12 DIAGNOSIS — I10 Essential (primary) hypertension: Secondary | ICD-10-CM

## 2019-09-12 DIAGNOSIS — Z7289 Other problems related to lifestyle: Secondary | ICD-10-CM | POA: Diagnosis not present

## 2019-09-12 DIAGNOSIS — F1090 Alcohol use, unspecified, uncomplicated: Secondary | ICD-10-CM

## 2019-09-12 LAB — COMPREHENSIVE METABOLIC PANEL
ALT: 17 U/L (ref 0–35)
AST: 19 U/L (ref 0–37)
Albumin: 4.8 g/dL (ref 3.5–5.2)
Alkaline Phosphatase: 96 U/L (ref 39–117)
BUN: 14 mg/dL (ref 6–23)
CO2: 25 mEq/L (ref 19–32)
Calcium: 10.3 mg/dL (ref 8.4–10.5)
Chloride: 102 mEq/L (ref 96–112)
Creatinine, Ser: 0.7 mg/dL (ref 0.40–1.20)
GFR: 82.95 mL/min (ref >60.00)
Glucose, Bld: 87 mg/dL (ref 70–99)
Potassium: 4 mEq/L (ref 3.5–5.1)
Sodium: 135 mEq/L (ref 135–145)
Total Bilirubin: 0.5 mg/dL (ref 0.2–1.2)
Total Protein: 7.8 g/dL (ref 6.0–8.3)

## 2019-09-12 LAB — CBC WITH DIFFERENTIAL/PLATELET
Basophils Absolute: 0.1 10*3/uL (ref 0.0–0.1)
Basophils Relative: 0.8 % (ref 0.0–3.0)
Eosinophils Absolute: 0.1 10*3/uL (ref 0.0–0.7)
Eosinophils Relative: 1.2 % (ref 0.0–5.0)
HCT: 47.4 % — ABNORMAL HIGH (ref 36.0–46.0)
Hemoglobin: 16.1 g/dL — ABNORMAL HIGH (ref 12.0–15.0)
Lymphocytes Relative: 22.3 % (ref 12.0–46.0)
Lymphs Abs: 1.9 10*3/uL (ref 0.7–4.0)
MCHC: 34 g/dL (ref 30.0–36.0)
MCV: 95.2 fl (ref 78.0–100.0)
Monocytes Absolute: 0.8 10*3/uL (ref 0.1–1.0)
Monocytes Relative: 8.8 % (ref 3.0–12.0)
Neutro Abs: 5.8 10*3/uL (ref 1.4–7.7)
Neutrophils Relative %: 66.9 % (ref 43.0–77.0)
Platelets: 387 10*3/uL (ref 150.0–400.0)
RBC: 4.98 Mil/uL (ref 3.87–5.11)
RDW: 13.8 % (ref 11.5–15.5)
WBC: 8.7 10*3/uL (ref 4.0–10.5)

## 2019-09-12 LAB — LIPID PANEL
Cholesterol: 218 mg/dL — ABNORMAL HIGH (ref 0–200)
HDL: 75.6 mg/dL (ref >39.00)
LDL Cholesterol: 114 mg/dL — ABNORMAL HIGH (ref 0–99)
NonHDL: 141.95
Total CHOL/HDL Ratio: 3
Triglycerides: 138 mg/dL (ref 0.0–149.0)
VLDL: 27.6 mg/dL (ref 0.0–40.0)

## 2019-09-12 LAB — TSH: TSH: 1.95 u[IU]/mL (ref 0.35–4.50)

## 2019-09-12 MED ORDER — AMLODIPINE BESYLATE 10 MG PO TABS: 10.0000 mg | ORAL_TABLET | Freq: Every day | ORAL | 3 refills | Status: DC

## 2019-09-12 MED ORDER — LEVOTHYROXINE SODIUM 88 MCG PO TABS: 88.0000 ug | ORAL_TABLET | Freq: Every day | ORAL | 3 refills | Status: DC

## 2019-09-12 NOTE — Assessment & Plan Note (Signed)
TSH today  No clinical changes Continues levothyroxine 88 mcg daily

## 2019-09-12 NOTE — Assessment & Plan Note (Signed)
Blood pressure is borderline high - better on 2nd check BP: 140/72  Urged her to check at home (handout given re: how to take bp accurately)  inst her to send some readings in the next month Would consider adding tx if needed Counseled on etoh intake  Also given handout re: DASH eating

## 2019-09-12 NOTE — Assessment & Plan Note (Signed)
Due for labs  Disc goals for lipids and reasons to control them Rev last labs with pt Rev low sat fat diet in detail  Diet is worse- fried foods lately  Expect LDL may be up  Lipid panel drawn

## 2019-09-12 NOTE — Assessment & Plan Note (Signed)
Again strongly recommended for health to limit to 1 drink per day or less  She is not motivated for change at this time  Voiced understanding  Denied dependence

## 2019-09-12 NOTE — Patient Instructions (Addendum)
Think about cutting down on alcohol - 1 drink per day is the maximum safe amount for a female liver  Check blood pressure readings at home while relaxed and arm is at heart level  Send me some readings in the next month or so -if high we can add medication   Labs today

## 2019-09-12 NOTE — Progress Notes (Signed)
Subjective:    Patient ID: Jocelyn Payne, female    DOB: July 08, 1951, 69 y.o.   MRN: [id number]  This visit occurred during the SARS-CoV-2 public health emergency.  Safety protocols were in place, including screening questions prior to the visit, additional usage of staff PPE, and extensive cleaning of exam room while observing appropriate contact time as indicated for disinfecting solutions.    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 years old)  This tires her out a bit  Very busy   Hypertension  Takes amlodipine 10 mg daily   bp is stable today -still not well controlled Missed to f/u last time after inc her amlodipine dose  No cp or palpitations or headaches or edema  No side effects to medicines  BP Readings from Last 3 Encounters:  09/12/19 (!) 162/76  08/16/18 (!) 154/78  08/04/18 (!) 150/74    Per pt it goes up and down  Generally [id number] systolic at home  She has omron cuff for the arm    Hypothyroidism  If any fatigue it is from caring for toddlers  Takes levothyroxine 88 mcg daily  Due for labs   Alcohol intake - 3-4 beers per day  She considers cutting back -but not that motivated   Still not smoking   Hyperlipidemia  Lab Results  Component Value Date   CHOL 195 05/31/2018   HDL 78.90 05/31/2018   LDLCALC 93 05/31/2018   LDLDIRECT 137.4 01/03/2013   TRIG 114.0 05/31/2018   CHOLHDL 2 05/31/2018   Due for labs Eating is not great lately  Too much junk food-baked sweets and potato chips    Health mt -not interested   Had cataract surgery - doing well   Patient Active Problem List   Diagnosis Date Noted  . Medicare annual wellness visit, initial 06/14/2018  . Alcohol intake above recommended sensible limits  05/05/2017  . Polycythemia 05/04/2017  . Positive colorectal cancer screening using DNA-based stool test 07/13/2016  . Welcome to Medicare preventive visit 04/02/2016  . History of shingles 03/26/2016  . Routine general medical examination at a health care facility 06/08/2011  . HLD (hyperlipidemia) 12/16/2010  . Hypothyroidism 10/07/2010  . ALLERGIC RHINITIS CAUSE UNSPECIFIED 08/12/2010  . Essential hypertension 08/12/2010   Past Medical History:  Diagnosis Date  . Allergic rhinitis, cause unspecified   . Conversion disorder   . HLD (hyperlipidemia)    high LDL  . HTN (hypertension)   . Hypothyroid   . OA (osteoarthritis)    left wrist from fracture    Past Surgical History:  Procedure Laterality Date  . WRIST SURGERY  02/2008   right fracture   Social History   Tobacco Use  . Smoking status: Former Smoker    Quit date: 10/24/2005    Years since quitting: 13.8  . Smokeless tobacco: Never Used  Substance Use Topics  . Alcohol use: Yes    Alcohol/week: 0.0 standard drinks    Comment: 4-5 beers at night   . Drug use: No   Family History  Problem Relation Age of Onset  . Hypertension Mother   . Cancer Father  Lung and esophageal  . Hypertension Brother   . Hypertension Sister   . Hypothyroidism Sister    No Known Allergies Current Outpatient Medications on File Prior to Visit  Medication Sig Dispense Refill  . metroNIDAZOLE (METROGEL) 1 % gel Apply 1 application topically daily as needed.     No current facility-administered medications on file prior to visit.     Review of Systems  Constitutional: Negative for activity change, appetite change, fatigue, fever and unexpected weight change.  HENT: Negative for congestion, ear pain, rhinorrhea, sinus pressure and sore throat.   Eyes: Negative for pain, redness and visual disturbance.  Respiratory: Negative for cough, shortness of breath and wheezing.   Cardiovascular: Negative for chest pain and  palpitations.  Gastrointestinal: Negative for abdominal pain, blood in stool, constipation and diarrhea.  Endocrine: Negative for polydipsia and polyuria.  Genitourinary: Negative for dysuria, frequency and urgency.  Musculoskeletal: Negative for arthralgias, back pain and myalgias.  Skin: Negative for pallor and rash.  Allergic/Immunologic: Negative for environmental allergies.  Neurological: Negative for dizziness, syncope and headaches.  Hematological: Negative for adenopathy. Does not bruise/bleed easily.  Psychiatric/Behavioral: Negative for decreased concentration and dysphoric mood. The patient is not nervous/anxious.        Objective:   Physical Exam Constitutional:      General: She is not in acute distress.    Appearance: She is well-developed.  HENT:     Head: Normocephalic and atraumatic.  Eyes:     Conjunctiva/sclera: Conjunctivae normal.     Pupils: Pupils are equal, round, and reactive to light.  Neck:     Thyroid: No thyromegaly.     Vascular: No carotid bruit or JVD.  Cardiovascular:     Rate and Rhythm: Normal rate and regular rhythm.     Heart sounds: Normal heart sounds. No gallop.   Pulmonary:     Effort: Pulmonary effort is normal. No respiratory distress.     Breath sounds: Normal breath sounds. No wheezing or rales.  Abdominal:     General: Bowel sounds are normal. There is no distension or abdominal bruit.     Palpations: Abdomen is soft. There is no mass.     Tenderness: There is no abdominal tenderness.  Musculoskeletal:     Cervical back: Normal range of motion and neck supple.     Right lower leg: No edema.     Left lower leg: No edema.  Lymphadenopathy:     Cervical: No cervical adenopathy.  Skin:    General: Skin is warm and dry.     Coloration: Skin is not pale.     Findings: No erythema or rash.  Neurological:     Mental Status: She is alert. Mental status is at baseline.     Coordination: Coordination normal.     Deep Tendon Reflexes:  Reflexes are normal and symmetric. Reflexes normal.     Comments: No tremor  Psychiatric:        Mood and Affect: Mood normal.        Cognition and Memory: Cognition and memory normal.           Assessment & Plan:   Problem List Items Addressed This Visit      Cardiovascular and Mediastinum   Essential hypertension - Primary    Blood pressure is borderline high - better on 2nd check BP: 140/72  Urged her to check at home (handout given re: how to take bp accurately)  inst her to send some readings  in the next month Would consider adding tx if needed Counseled on etoh intake  Also given handout re: DASH eating       Relevant Medications   amLODipine (NORVASC) 10 MG tablet   Other Relevant Orders   CBC with Differential/Platelet   Comprehensive metabolic panel   Lipid panel   TSH     Endocrine   Hypothyroidism    TSH today  No clinical changes Continues levothyroxine 88 mcg daily        Relevant Medications   levothyroxine (SYNTHROID) 88 MCG tablet   Other Relevant Orders   TSH     Other   HLD (hyperlipidemia)    Due for labs  Disc goals for lipids and reasons to control them Rev last labs with pt Rev low sat fat diet in detail  Diet is worse- fried foods lately  Expect LDL may be up  Lipid panel drawn      Relevant Medications   amLODipine (NORVASC) 10 MG tablet   Other Relevant Orders   Lipid panel   Polycythemia    Cbc today  No c/o bleeding or bruising      Alcohol intake above recommended sensible limits    Again strongly recommended for health to limit to 1 drink per day or less  She is not motivated for change at this time  Voiced understanding  Denied dependence

## 2019-09-12 NOTE — Assessment & Plan Note (Signed)
Cbc today  No c/o bleeding or bruising

## 2019-10-02 ENCOUNTER — Ambulatory Visit: Payer: Medicare Other | Attending: Internal Medicine

## 2019-10-02 DIAGNOSIS — Z23 Encounter for immunization: Secondary | ICD-10-CM | POA: Insufficient documentation

## 2019-10-02 NOTE — Progress Notes (Signed)
   Covid-19 Vaccination Clinic  Name:  Jocelyn Payne    MRN: [id number] DOB: 13-Jul-1951  10/02/2019  [name] was observed post Covid-19 immunization for 15 minutes without incident. She was provided with Vaccine Information Sheet and instruction to access the V-Safe system.   [name] was instructed to call 911 with any severe reactions post vaccine: Marland Kitchen Difficulty breathing  . Swelling of face and throat  . A fast heartbeat  . A bad rash all over body  . Dizziness and weakness   Immunizations Administered    Name Date Dose VIS Date Route   Pfizer COVID-19 Vaccine 10/02/2019  8:57 AM 0.3 mL 07/06/2019 Intramuscular   Manufacturer: [employer]   Lot: [id number]   [place]: [id number]

## 2019-10-29 IMAGING — DX DG CHEST 2V
2 series · 2 of 2 positions shown · non-contrast
Comparison: 07/20/2018

CLINICAL DATA: Follow-up RIGHT lower lobe pneumonia due to
infectious organism, clinically resolved

EXAM:
CHEST - 2 VIEW

[chest pa]
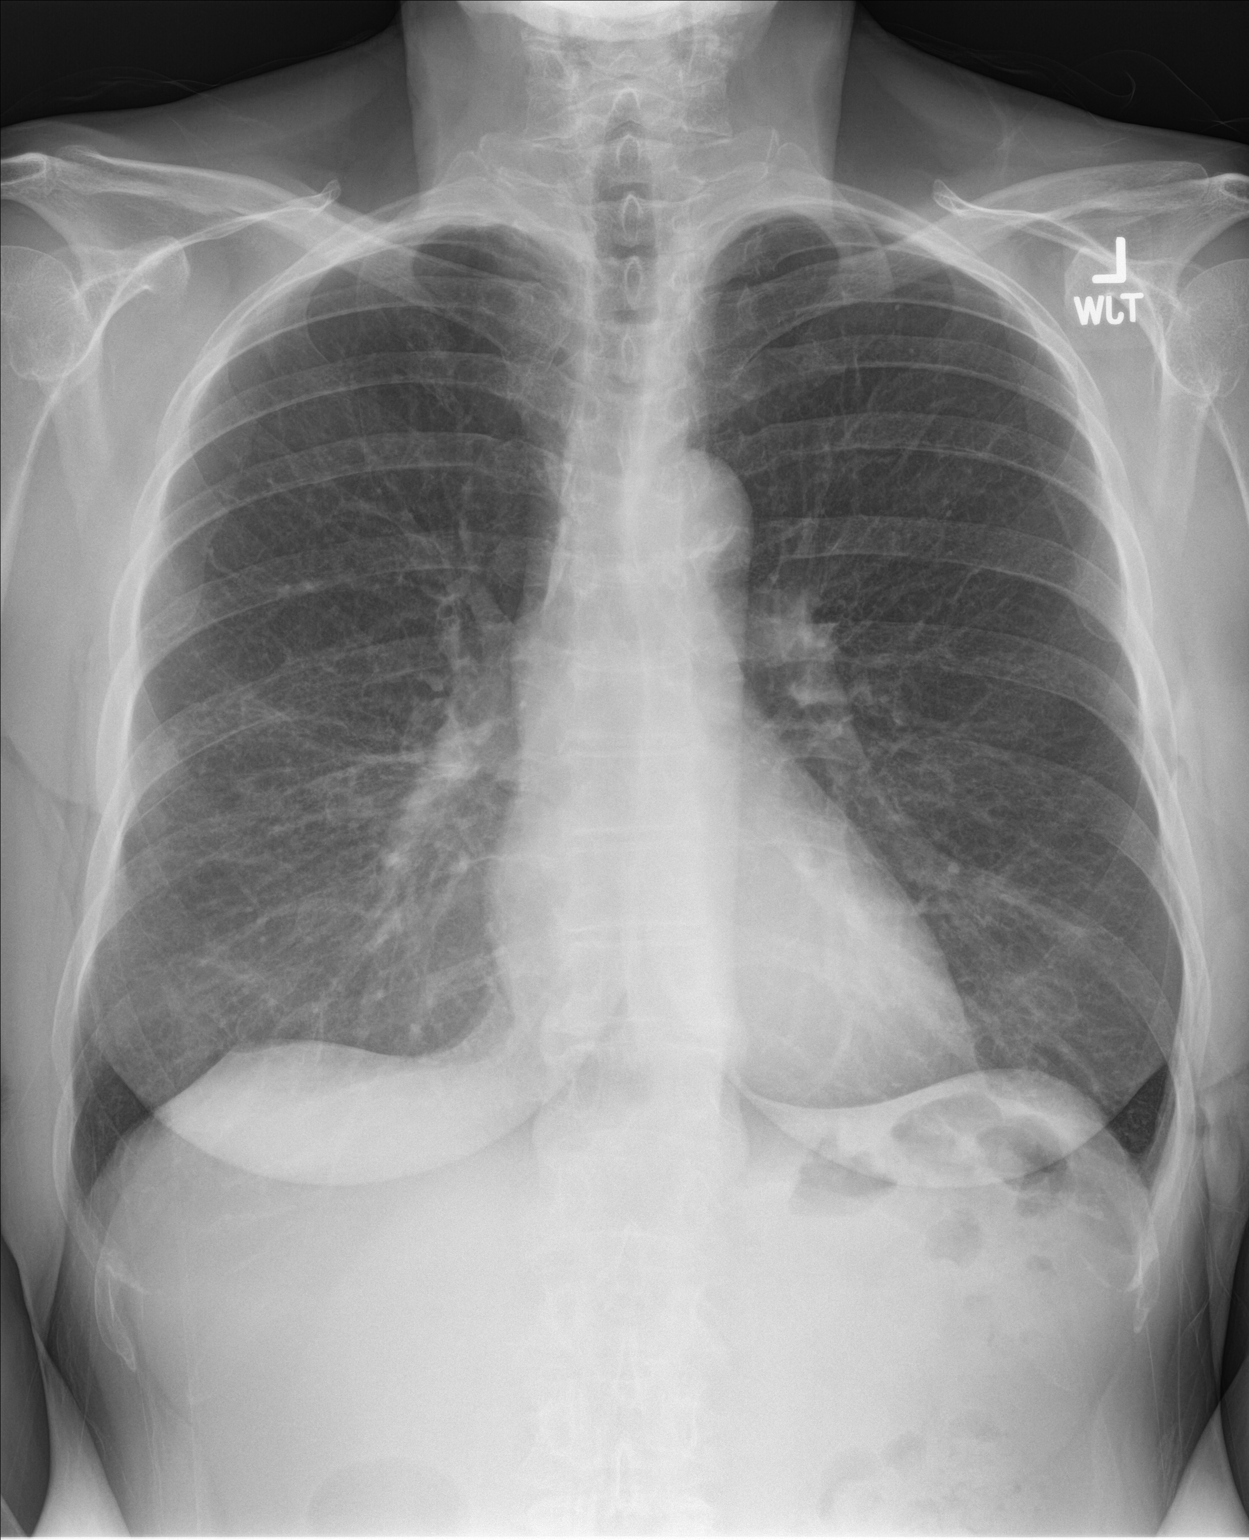

[chest lat]
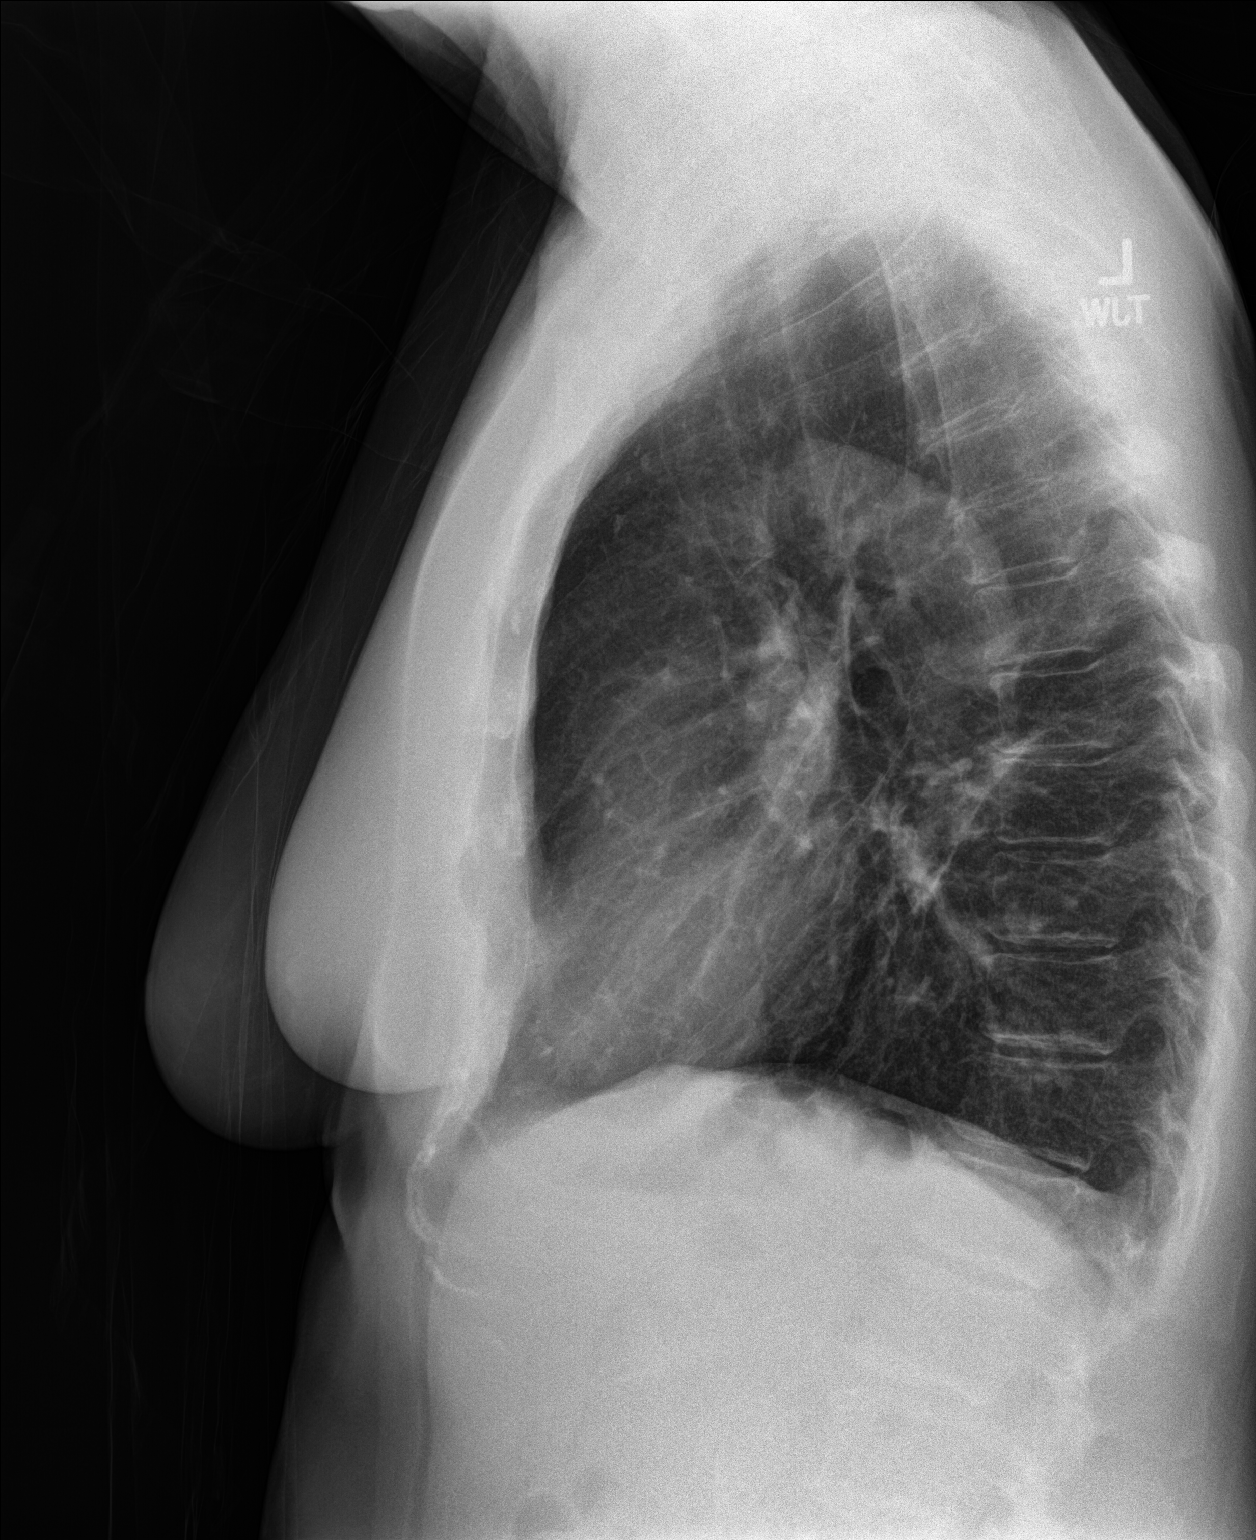

[2 of 2 positions shown; findings below may reference images not displayed]

FINDINGS: Normal heart size, mediastinal contours, and pulmonary vascularity.

Atherosclerotic calcification aorta.

Minimal peribronchial thickening and hyperinflation.

Resolution of previously identified RIGHT lower lobe pneumonia.

Lungs now clear with no evidence of infiltrate, pleural effusion or
pneumothorax.

Bones unremarkable.
IMPRESSION: Resolution of previous RIGHT lower lobe pneumonia.

No acute abnormalities.

## 2020-04-23 DIAGNOSIS — Z85828 Personal history of other malignant neoplasm of skin: Secondary | ICD-10-CM | POA: Diagnosis not present

## 2020-04-23 DIAGNOSIS — L570 Actinic keratosis: Secondary | ICD-10-CM | POA: Diagnosis not present

## 2020-04-23 DIAGNOSIS — L718 Other rosacea: Secondary | ICD-10-CM | POA: Diagnosis not present

## 2020-05-28 DIAGNOSIS — L718 Other rosacea: Secondary | ICD-10-CM | POA: Diagnosis not present

## 2020-05-28 DIAGNOSIS — L814 Other melanin hyperpigmentation: Secondary | ICD-10-CM | POA: Diagnosis not present

## 2020-05-28 DIAGNOSIS — L821 Other seborrheic keratosis: Secondary | ICD-10-CM | POA: Diagnosis not present

## 2020-05-28 DIAGNOSIS — D1801 Hemangioma of skin and subcutaneous tissue: Secondary | ICD-10-CM | POA: Diagnosis not present

## 2020-05-28 DIAGNOSIS — Z85828 Personal history of other malignant neoplasm of skin: Secondary | ICD-10-CM | POA: Diagnosis not present

## 2020-05-28 DIAGNOSIS — L720 Epidermal cyst: Secondary | ICD-10-CM | POA: Diagnosis not present

## 2020-06-11 DIAGNOSIS — Z23 Encounter for immunization: Secondary | ICD-10-CM | POA: Diagnosis not present

## 2020-06-28 DIAGNOSIS — Z23 Encounter for immunization: Secondary | ICD-10-CM | POA: Diagnosis not present

## 2020-10-15 ENCOUNTER — Telehealth: Payer: Self-pay | Admitting: Family Medicine

## 2020-10-15 NOTE — Telephone Encounter (Signed)
I left a detailed message on patient's voice mail to call back and schedule cpx or med refill f/u and her medication will be refilled up until her appointment.

## 2020-10-15 NOTE — Telephone Encounter (Signed)
Patient will keep the med refill appt on 10/31/20 and cancelled the physical.

## 2020-10-15 NOTE — Telephone Encounter (Signed)
Pt called back got her scheduled for 4/8 for medication refill and on 5/6 for her CPE.

## 2020-10-15 NOTE — Telephone Encounter (Signed)
Pt hasn't been seen in over a year and no future appts., will route to [facility] to schedule CPE/ AWV or at least a med refill f/u appt.  Please route med refill back to me once appt has been made. Thanks

## 2020-10-15 NOTE — Telephone Encounter (Signed)
Med was filled but will route back to [place] to fix pt's appts. Pt only needs one, either a CPE or med refill. If pt's wants CPE then she needs the AWV phone call with nurse since pt has medicare. thanks

## 2020-10-31 ENCOUNTER — Ambulatory Visit: Payer: Medicare Other | Admitting: Family Medicine

## 2020-11-12 ENCOUNTER — Other Ambulatory Visit: Payer: Self-pay | Admitting: Family Medicine

## 2020-11-20 ENCOUNTER — Telehealth: Payer: Self-pay | Admitting: Family Medicine

## 2020-11-20 DIAGNOSIS — I10 Essential (primary) hypertension: Secondary | ICD-10-CM

## 2020-11-20 DIAGNOSIS — D751 Secondary polycythemia: Secondary | ICD-10-CM

## 2020-11-20 DIAGNOSIS — E7800 Pure hypercholesterolemia, unspecified: Secondary | ICD-10-CM

## 2020-11-20 DIAGNOSIS — E039 Hypothyroidism, unspecified: Secondary | ICD-10-CM

## 2020-11-20 NOTE — Telephone Encounter (Signed)
-----   Message from Cloyd Stagers, RT sent at 11/04/2020  1:42 PM EDT ----- Regarding: Lab Orders for Friday 4.29.2022 Please place lab orders for Friday 4.29.2022, office visit for physical on Friday 5.6.2022 Thank you, Dyke Maes RT(R)

## 2020-11-21 ENCOUNTER — Other Ambulatory Visit (INDEPENDENT_AMBULATORY_CARE_PROVIDER_SITE_OTHER): Payer: Medicare Other

## 2020-11-21 ENCOUNTER — Other Ambulatory Visit: Payer: Self-pay

## 2020-11-21 ENCOUNTER — Other Ambulatory Visit: Payer: Medicare Other

## 2020-11-21 DIAGNOSIS — E039 Hypothyroidism, unspecified: Secondary | ICD-10-CM

## 2020-11-21 DIAGNOSIS — E7800 Pure hypercholesterolemia, unspecified: Secondary | ICD-10-CM | POA: Diagnosis not present

## 2020-11-21 DIAGNOSIS — I10 Essential (primary) hypertension: Secondary | ICD-10-CM

## 2020-11-21 DIAGNOSIS — D751 Secondary polycythemia: Secondary | ICD-10-CM

## 2020-11-21 LAB — LIPID PANEL
Cholesterol: 228 mg/dL — ABNORMAL HIGH (ref 0–200)
HDL: 79.9 mg/dL (ref >39.00)
LDL Cholesterol: 131 mg/dL — ABNORMAL HIGH (ref 0–99)
NonHDL: 148.57
Total CHOL/HDL Ratio: 3
Triglycerides: 90 mg/dL (ref 0.0–149.0)
VLDL: 18 mg/dL (ref 0.0–40.0)

## 2020-11-21 LAB — COMPREHENSIVE METABOLIC PANEL
ALT: 19 U/L (ref 0–35)
AST: 20 U/L (ref 0–37)
Albumin: 4.9 g/dL (ref 3.5–5.2)
Alkaline Phosphatase: 97 U/L (ref 39–117)
BUN: 13 mg/dL (ref 6–23)
CO2: 28 mEq/L (ref 19–32)
Calcium: 10.3 mg/dL (ref 8.4–10.5)
Chloride: 100 mEq/L (ref 96–112)
Creatinine, Ser: 0.79 mg/dL (ref 0.40–1.20)
GFR: 75.87 mL/min (ref >60.00)
Glucose, Bld: 106 mg/dL — ABNORMAL HIGH (ref 70–99)
Potassium: 4.2 mEq/L (ref 3.5–5.1)
Sodium: 137 mEq/L (ref 135–145)
Total Bilirubin: 0.6 mg/dL (ref 0.2–1.2)
Total Protein: 8.2 g/dL (ref 6.0–8.3)

## 2020-11-21 LAB — CBC WITH DIFFERENTIAL/PLATELET
Basophils Absolute: 0.1 10*3/uL (ref 0.0–0.1)
Basophils Relative: 1.1 % (ref 0.0–3.0)
Eosinophils Absolute: 0.2 10*3/uL (ref 0.0–0.7)
Eosinophils Relative: 2 % (ref 0.0–5.0)
HCT: 50.1 % — ABNORMAL HIGH (ref 36.0–46.0)
Hemoglobin: 17.1 g/dL — ABNORMAL HIGH (ref 12.0–15.0)
Lymphocytes Relative: 25.6 % (ref 12.0–46.0)
Lymphs Abs: 2 10*3/uL (ref 0.7–4.0)
MCHC: 34.2 g/dL (ref 30.0–36.0)
MCV: 96.6 fl (ref 78.0–100.0)
Monocytes Absolute: 0.7 10*3/uL (ref 0.1–1.0)
Monocytes Relative: 8.7 % (ref 3.0–12.0)
Neutro Abs: 4.9 10*3/uL (ref 1.4–7.7)
Neutrophils Relative %: 62.6 % (ref 43.0–77.0)
Platelets: 403 10*3/uL — ABNORMAL HIGH (ref 150.0–400.0)
RBC: 5.19 Mil/uL — ABNORMAL HIGH (ref 3.87–5.11)
RDW: 13.6 % (ref 11.5–15.5)
WBC: 7.8 10*3/uL (ref 4.0–10.5)

## 2020-11-21 LAB — TSH: TSH: 4.58 u[IU]/mL — ABNORMAL HIGH (ref 0.35–4.50)

## 2020-11-28 ENCOUNTER — Encounter: Payer: Medicare Other | Admitting: Family Medicine

## 2020-12-19 ENCOUNTER — Encounter: Payer: Medicare Other | Admitting: Family Medicine

## 2021-01-11 ENCOUNTER — Other Ambulatory Visit: Payer: Self-pay | Admitting: Family Medicine

## 2021-01-13 NOTE — Telephone Encounter (Signed)
Please schedule MWV with [facility] and CPE with fasting labs prior with Dr. Glori Bickers.  Please send back to me once schedule so I can refill her medication.

## 2021-01-13 NOTE — Telephone Encounter (Signed)
LVMTCB

## 2021-01-19 ENCOUNTER — Telehealth: Payer: Self-pay | Admitting: Family Medicine

## 2021-01-19 ENCOUNTER — Other Ambulatory Visit: Payer: Self-pay | Admitting: Family Medicine

## 2021-01-19 ENCOUNTER — Telehealth (INDEPENDENT_AMBULATORY_CARE_PROVIDER_SITE_OTHER): Payer: Medicare Other | Admitting: Physician Assistant

## 2021-01-19 VITALS — Temp 101.9°F

## 2021-01-19 DIAGNOSIS — R509 Fever, unspecified: Secondary | ICD-10-CM | POA: Diagnosis not present

## 2021-01-19 MED ORDER — AMLODIPINE BESYLATE 10 MG PO TABS: 10.0000 mg | ORAL_TABLET | Freq: Every day | ORAL | 0 refills | Status: DC

## 2021-01-19 MED ORDER — CEFPROZIL 500 MG PO TABS: 500.0000 mg | ORAL_TABLET | Freq: Two times a day (BID) | ORAL | 0 refills | Status: AC

## 2021-01-19 NOTE — Telephone Encounter (Signed)
Per epic it looks like she has an appt with primary care today at [facility] If she is est as a new pt- please sent the pended 30 pills to get by  If that is not correct-change to 90 and schedule f/u here thanks

## 2021-01-19 NOTE — Patient Instructions (Signed)
Alternate Tylenol and Ibuprofen to help with fever and chills. Push fluids and rest today. Go to the emergency dept for acutely worsening symptoms.  Call back tomorrow afternoon with updated COVID test result, as well as update on how you are feeling.  Start on the Cefprozil after 24 hours if no significant improvement in symptoms.

## 2021-01-19 NOTE — Telephone Encounter (Signed)
Med refilled once  

## 2021-01-19 NOTE — Progress Notes (Signed)
Virtual Visit via Telephone Note  I connected with Jocelyn Payne on 01/19/21 at  9:00 AM EDT by telephone and verified that I am speaking with the correct person using two identifiers.  Location: Patient: Home Provider: Therapist, music at [employer]   I discussed the limitations, risks, security and privacy concerns of performing an evaluation and management service by telephone and the availability of in person appointments. I also discussed with the patient that there may be a patient responsible charge related to this service. The patient expressed understanding and agreed to proceed.   History of Present Illness: Chief complaint: URI symptoms Symptom onset: Yesterday  Pertinent positives: Fever 101.9 F, body aches, headache, sl cough Pertinent negatives: Chest pain, SOB, loss of taste or smell, ST ear pain, rash, swelling  Treatments tried: Tylenol 500 mg one yesterday and one this morning  Vaccine status: COVID vax and one booster, flu vaccine Sick exposure: None she is aware of   Two at home COVID tests were negative yesterday.  Observations/Objective: Temp 101.9 F Unable to obtain other vitals at this visit  Assessment and Plan: 1. Fever and chills Viral vs bacterial infection. Pt understanding about limitations given the nature of the visit. With two negative COVID tests at home yesterday, hopefully this is a different viral-type illness. However, she is very concerned about possible pneumonia, as she had this three years ago. Given the nature of her concern, I did send Rx for Cefprozil 500 mg 1 tab PO BID x 7 days to start after 24 hours if she does not see significant improvement in her symptoms. She is going to retest for COVID tomorrow afternoon and call me with results, as well as update on how she is feeling. She will go to the ED in any case of SOB or CP or acute weakness.    Follow Up Instructions:    I discussed the assessment and treatment plan with  the patient. The patient was provided an opportunity to ask questions and all were answered. The patient agreed with the plan and demonstrated an understanding of the instructions.   The patient was advised to call back or seek an in-person evaluation if the symptoms worsen or if the condition fails to improve as anticipated.  I provided 18 minutes, 58 seconds of non-face-to-face time during this encounter.   Skylor Hughson M Shelbi Vaccaro, PA-C

## 2021-01-19 NOTE — Telephone Encounter (Signed)
Appt today with [facility] is just a virtual sick appt. Pt has scheduled her CPE with PCP next month, med refilled once

## 2021-01-19 NOTE — Telephone Encounter (Signed)
Pt cancelled all of her appts. Hasn't been seen in over a year. Pt needs to r/s her CPE or at least a med refill appt before we refill any meds. Will route to support pool to schedule appt before meds are filled. Once appt has been schedule please route note back to me to refill med. Thanks

## 2021-01-19 NOTE — Telephone Encounter (Signed)
  LAST APPOINTMENT DATE: 01/11/2021   NEXT APPOINTMENT DATE:@Visit  date not found  MEDICATION: Amlodipine 10 mg   PHARMACY:[facility]  [place]   Let patient know to contact pharmacy at the end of the day to make sure medication is ready.  Please notify patient to allow 48-72 hours to process  Encourage patient to contact the pharmacy for refills or they can request refills through [place]:   LAST REFILL:  QTY:  REFILL DATE:    OTHER COMMENTS:    Okay for refill?  Please advise

## 2021-01-19 NOTE — Telephone Encounter (Signed)
Patient called in sick with a temperature of 101.7 and congestion. Patient stated that she had taken 2 covid at home test and they were negative. Explained to patient that we could not bring her in the office and that currently we do not have any openings here. Patient became angry and started being very rude right off the bat. I was in the process of looking and asking to see if we had any openings elsewhere. Patient was like I want to be seen in the office. I offered her to do a mychart visit she began to yell at me and state " I don't have a computer" I asked if she had a smart phone she yelled NO. I told her that we could do a phone call with another provider at the [facility] location and she said " I want to see a DR. Not a provider." I told her that a NP,PA, MD were all called Providers. She then began to become even more rude and angry as she said well being you can't do anything else can you put in a medication refill and set up my physical. I told her yes I could and give me a second to put in the request for her for the medicine. I was sending a request for the medication refill and the patient began saying "Are we done yet? Why can't you hurry up? " And she then began talking to someone in the home saying how I obviously didn't know how to do my job and that It was taking forever. I asked her politely being she was frustrated did she want to speak to a manager and she said no just hurry up cause I don't feel good. Explained to her that I was working on multiple things at one time for her and that I was trying to assist her that I would appreciate her patience in assisting her. I offered her a call back to schedule CPE. Patient the whole time in conversation was extremely rude and hateful and overall very disrespectful to me as staff. Was trying to help her in everyway and she continued to be rude. Passing on to manager and provider to make them aware of the patient behavior. EM

## 2021-01-19 NOTE — Telephone Encounter (Signed)
Patient is scheduled. She did refuse bloodwork and MCWV. EM

## 2021-01-19 NOTE — Telephone Encounter (Signed)
Noted.  I have confirmed that patient is being seen virtually (via phone) by PA, Allwardt at [place] location earlier this am.    I understand patient is frustrated and we are working to improve access to our office.  We are fully booked today and I am glad we were able to get her virtually connected with another office.  I know when you feel bad it makes everything worse; however patient shouldn't take that out on staff.  If this is or becomes a repeated pattern for patient then we will need to address.

## 2021-02-03 DIAGNOSIS — E039 Hypothyroidism, unspecified: Secondary | ICD-10-CM | POA: Diagnosis not present

## 2021-02-03 DIAGNOSIS — Z0000 Encounter for general adult medical examination without abnormal findings: Secondary | ICD-10-CM | POA: Diagnosis not present

## 2021-02-11 ENCOUNTER — Other Ambulatory Visit: Payer: Self-pay | Admitting: Family Medicine

## 2021-02-11 NOTE — Telephone Encounter (Signed)
Pt has cancelled 5 CPE or f/u in a row, no recent or future appts. Will decline until pt gets an appt

## 2021-02-20 ENCOUNTER — Ambulatory Visit: Payer: Medicare Other | Admitting: Family Medicine

## 2021-04-09 DIAGNOSIS — Z1231 Encounter for screening mammogram for malignant neoplasm of breast: Secondary | ICD-10-CM | POA: Diagnosis not present

## 2021-04-18 ENCOUNTER — Other Ambulatory Visit: Payer: Self-pay | Admitting: Family Medicine

## 2021-04-24 NOTE — Telephone Encounter (Signed)
Sent Mychart message.

## 2021-04-28 DIAGNOSIS — E039 Hypothyroidism, unspecified: Secondary | ICD-10-CM | POA: Diagnosis not present

## 2021-05-09 DIAGNOSIS — Z1212 Encounter for screening for malignant neoplasm of rectum: Secondary | ICD-10-CM | POA: Diagnosis not present

## 2021-05-09 DIAGNOSIS — Z1211 Encounter for screening for malignant neoplasm of colon: Secondary | ICD-10-CM | POA: Diagnosis not present

## 2021-05-15 LAB — COLOGUARD: COLOGUARD: POSITIVE — AB

## 2021-05-20 DIAGNOSIS — Z23 Encounter for immunization: Secondary | ICD-10-CM | POA: Diagnosis not present

## 2021-05-28 DIAGNOSIS — L570 Actinic keratosis: Secondary | ICD-10-CM | POA: Diagnosis not present

## 2021-05-28 DIAGNOSIS — L821 Other seborrheic keratosis: Secondary | ICD-10-CM | POA: Diagnosis not present

## 2021-05-28 DIAGNOSIS — D1801 Hemangioma of skin and subcutaneous tissue: Secondary | ICD-10-CM | POA: Diagnosis not present

## 2021-05-28 DIAGNOSIS — L918 Other hypertrophic disorders of the skin: Secondary | ICD-10-CM | POA: Diagnosis not present

## 2021-05-28 DIAGNOSIS — D2371 Other benign neoplasm of skin of right lower limb, including hip: Secondary | ICD-10-CM | POA: Diagnosis not present

## 2021-05-28 DIAGNOSIS — Z85828 Personal history of other malignant neoplasm of skin: Secondary | ICD-10-CM | POA: Diagnosis not present

## 2021-07-06 DIAGNOSIS — K589 Irritable bowel syndrome without diarrhea: Secondary | ICD-10-CM | POA: Diagnosis not present

## 2021-07-06 DIAGNOSIS — R195 Other fecal abnormalities: Secondary | ICD-10-CM | POA: Diagnosis not present

## 2021-07-25 ENCOUNTER — Other Ambulatory Visit: Payer: Self-pay | Admitting: Family Medicine

## 2021-07-28 NOTE — Telephone Encounter (Signed)
Pt hasn't been seen since 2021, please schedule a AWV phone call/ CPE part 2 or at least a med refill appt and then route back to me tor refill

## 2021-07-28 NOTE — Telephone Encounter (Signed)
Refilled times one pending appt

## 2021-07-30 NOTE — Telephone Encounter (Signed)
Lvm for pt to schedule AWV and sent my chart letter

## 2021-10-16 DIAGNOSIS — K635 Polyp of colon: Secondary | ICD-10-CM | POA: Diagnosis not present

## 2021-10-16 DIAGNOSIS — R195 Other fecal abnormalities: Secondary | ICD-10-CM | POA: Diagnosis not present

## 2021-10-16 DIAGNOSIS — K52831 Collagenous colitis: Secondary | ICD-10-CM | POA: Diagnosis not present

## 2021-10-16 DIAGNOSIS — K6389 Other specified diseases of intestine: Secondary | ICD-10-CM | POA: Diagnosis not present

## 2021-10-16 DIAGNOSIS — Z1211 Encounter for screening for malignant neoplasm of colon: Secondary | ICD-10-CM | POA: Diagnosis not present

## 2021-10-16 DIAGNOSIS — K648 Other hemorrhoids: Secondary | ICD-10-CM | POA: Diagnosis not present

## 2021-10-20 DIAGNOSIS — K635 Polyp of colon: Secondary | ICD-10-CM | POA: Diagnosis not present

## 2021-10-20 DIAGNOSIS — K52831 Collagenous colitis: Secondary | ICD-10-CM | POA: Diagnosis not present

## 2022-04-08 DIAGNOSIS — I10 Essential (primary) hypertension: Secondary | ICD-10-CM | POA: Diagnosis not present

## 2022-04-08 DIAGNOSIS — R209 Unspecified disturbances of skin sensation: Secondary | ICD-10-CM | POA: Diagnosis not present

## 2022-04-08 DIAGNOSIS — R799 Abnormal finding of blood chemistry, unspecified: Secondary | ICD-10-CM | POA: Diagnosis not present

## 2022-04-08 DIAGNOSIS — R0602 Shortness of breath: Secondary | ICD-10-CM | POA: Diagnosis not present

## 2022-04-08 DIAGNOSIS — E039 Hypothyroidism, unspecified: Secondary | ICD-10-CM | POA: Diagnosis not present

## 2022-04-08 DIAGNOSIS — E785 Hyperlipidemia, unspecified: Secondary | ICD-10-CM | POA: Diagnosis not present

## 2022-04-08 DIAGNOSIS — Z0000 Encounter for general adult medical examination without abnormal findings: Secondary | ICD-10-CM | POA: Diagnosis not present

## 2022-04-08 DIAGNOSIS — Z23 Encounter for immunization: Secondary | ICD-10-CM | POA: Diagnosis not present

## 2022-05-12 DIAGNOSIS — Z1231 Encounter for screening mammogram for malignant neoplasm of breast: Secondary | ICD-10-CM | POA: Diagnosis not present

## 2022-06-10 DIAGNOSIS — D2261 Melanocytic nevi of right upper limb, including shoulder: Secondary | ICD-10-CM | POA: Diagnosis not present

## 2022-06-10 DIAGNOSIS — D225 Melanocytic nevi of trunk: Secondary | ICD-10-CM | POA: Diagnosis not present

## 2022-06-10 DIAGNOSIS — Z85828 Personal history of other malignant neoplasm of skin: Secondary | ICD-10-CM | POA: Diagnosis not present

## 2022-06-10 DIAGNOSIS — D2272 Melanocytic nevi of left lower limb, including hip: Secondary | ICD-10-CM | POA: Diagnosis not present

## 2022-06-10 DIAGNOSIS — D2271 Melanocytic nevi of right lower limb, including hip: Secondary | ICD-10-CM | POA: Diagnosis not present

## 2022-06-10 DIAGNOSIS — D1801 Hemangioma of skin and subcutaneous tissue: Secondary | ICD-10-CM | POA: Diagnosis not present

## 2022-06-10 DIAGNOSIS — L718 Other rosacea: Secondary | ICD-10-CM | POA: Diagnosis not present

## 2022-06-10 DIAGNOSIS — D2262 Melanocytic nevi of left upper limb, including shoulder: Secondary | ICD-10-CM | POA: Diagnosis not present

## 2022-06-10 DIAGNOSIS — L821 Other seborrheic keratosis: Secondary | ICD-10-CM | POA: Diagnosis not present

## 2022-09-16 ENCOUNTER — Other Ambulatory Visit: Payer: Self-pay

## 2022-09-16 DIAGNOSIS — F17200 Nicotine dependence, unspecified, uncomplicated: Secondary | ICD-10-CM

## 2023-01-17 ENCOUNTER — Ambulatory Visit: Payer: Medicare Other | Admitting: Cardiology

## 2023-01-23 DIAGNOSIS — I2510 Atherosclerotic heart disease of native coronary artery without angina pectoris: Secondary | ICD-10-CM | POA: Insufficient documentation

## 2023-01-23 NOTE — Progress Notes (Signed)
Cardiology Office Note  Date:  01/24/2023   ID:  Jocelyn Payne, DOB 13-Sep-1950, MRN [id number]  PCP:  Judy Pimple, MD   Chief Complaint  Patient presents with   New Patient (Initial Visit)    Ref by Lance Bosch, NP for coronary artery calcification on a CT scan. Medications reviewed by the patient verbally.     HPI:  Jocelyn Payne is a 72 year old woman with past medical history of Smoking, former, quit 17 years ago Essential hypertension Hypothyroidism Who presents by referral from Lance Bosch for coronary calcification on CT  On discussion today, Jocelyn Payne reports that she is doing relatively well Denies significant shortness of breath or chest pain on exertion Former smoking history but quit many years ago History of hyperlipidemia, untreated No significant diabetes history  [facility] March [facility], unable to see images Mild coronary artery calcification detailed in the report Moderate emphysema  Walks 2 miles a day No SOB, no chest pain Helps take care of 4 grandchildren  EKG personally reviewed by myself on todays visit EKG Interpretation Date/Time:  Monday January 24 2023 15:38:58 EDT Ventricular Rate:  94 PR Interval:  164 QRS Duration:  78 QT Interval:  366 QTC Calculation: 457 R Axis:   39  Text Interpretation: Normal sinus rhythm with sinus arrhythmia Possible Left atrial enlargement Nonspecific ST and T wave abnormality When compared with ECG of 20-Mar-2008 10:41, Nonspecific T wave abnormality now evident in Lateral leads QT has lengthened Confirmed by Julien Nordmann [phone]) on 01/24/2023 3:59:39 PM    PMH:   has a past medical history of Allergic rhinitis, cause unspecified, Conversion disorder, HLD (hyperlipidemia), HTN (hypertension), Hypothyroid, and OA (osteoarthritis).  PSH:    Past Surgical History:  Procedure Laterality Date   WRIST SURGERY  02/2008   right fracture    Current Outpatient Medications  Medication Sig Dispense Refill    amLODipine (NORVASC) 10 MG tablet Take 1 tablet (10 mg total) by mouth daily. 90 tablet 0   levothyroxine (SYNTHROID) 88 MCG tablet TAKE 1 TABLET (88 MCG TOTAL) BY MOUTH DAILY. IN AM BEFORE BREAKFAST 90 tablet 0   No current facility-administered medications for this visit.    Allergies:   Patient has no known allergies.   Social History:  The patient  reports that she quit smoking about 17 years ago. Her smoking use included cigarettes. She has a 25.00 pack-year smoking history. She has never used smokeless tobacco. She reports current alcohol use. She reports that she does not use drugs.   Family History:   family history includes Cancer in her father; Hyperlipidemia in her mother; Hypertension in her brother, mother, and sister; Hypothyroidism in her sister.    Review of Systems: Review of Systems  Constitutional: Negative.   HENT: Negative.    Respiratory: Negative.    Cardiovascular: Negative.   Gastrointestinal: Negative.   Musculoskeletal: Negative.   Neurological: Negative.   Psychiatric/Behavioral: Negative.    All other systems reviewed and are negative.   PHYSICAL EXAM: VS:  BP (!) 160/78 (BP Location: Right Arm, Patient Position: Sitting, Cuff Size: Normal)   Pulse 94   Ht 5\' 1"  (1.549 m)   Wt 120 lb 4 oz (54.5 kg)   SpO2 98%   BMI 22.72 kg/m  , BMI Body mass index is 22.72 kg/m. GEN: Well nourished, well developed, in no acute distress HEENT: normal Neck: no JVD, carotid bruits, or masses Cardiac: RRR; no murmurs, rubs, or gallops,no edema  Respiratory:  clear to auscultation bilaterally, normal work of breathing GI: soft, nontender, nondistended, + BS MS: no deformity or atrophy Skin: warm and dry, no rash Neuro:  Strength and sensation are intact Psych: euthymic mood, full affect   Recent Labs: No results found for requested labs within last 365 days.    Lipid Panel Lab Results  Component Value Date   CHOL 228 (H) 11/21/2020   HDL 79.90  11/21/2020   LDLCALC 131 (H) 11/21/2020   TRIG 90.0 11/21/2020      Wt Readings from Last 3 Encounters:  01/24/23 120 lb 4 oz (54.5 kg)  09/12/19 136 lb 3 oz (61.8 kg)  08/16/18 129 lb 12 oz (58.9 kg)      ASSESSMENT AND PLAN:  Problem List Items Addressed This Visit       Cardiology Problems   Coronary artery calcification - Primary   Relevant Orders   EKG 12-Lead (Completed)   Essential hypertension   Relevant Orders   EKG 12-Lead (Completed)   HLD (hyperlipidemia)   Coronary calcification Noted to be mild on CT scan report March 2024 Denies anginal symptoms  risk factors include former smoking, hyperlipidemia No further ischemic workup needed at this time She has changed her diet, weight trending downward, exercising on a regular basis -We have recommended medication for hyperlipidemia as detailed below  Essential hypertension Blood pressure elevated, even on my repeat 160 systolic Anxious on todays visit Takes amlodipine 10 mg daily, has not been checking blood pressure at home Recommended she closely monitor blood pressure at home and call us with numbers  Hyperlipidemia Total cholesterol 220, given underlying coronary disease we have recommended more aggressive management Suggested she start Crestor 10 mg daily Crestor can be titrated upwards, Zetia could be added to achieve total cholesterol less than 150  Hypothyroidism Managed by primary care     Total encounter time more than 60 minutes  Greater than 50% was spent in counseling and coordination of care with the patient    Signed, Dossie Arbour, M.D., Ph.D. [facility] [place], [place] [phone]

## 2023-01-24 ENCOUNTER — Encounter: Payer: Self-pay | Admitting: Cardiovascular Disease

## 2023-01-24 ENCOUNTER — Ambulatory Visit: Payer: Medicare Other | Attending: Cardiology | Admitting: Cardiovascular Disease

## 2023-01-24 VITALS — BP 160/78 | HR 94 | Ht 61.0 in | Wt 120.2 lb

## 2023-01-24 DIAGNOSIS — I10 Essential (primary) hypertension: Secondary | ICD-10-CM | POA: Insufficient documentation

## 2023-01-24 DIAGNOSIS — I2584 Coronary atherosclerosis due to calcified coronary lesion: Secondary | ICD-10-CM | POA: Diagnosis present

## 2023-01-24 DIAGNOSIS — E7800 Pure hypercholesterolemia, unspecified: Secondary | ICD-10-CM | POA: Diagnosis present

## 2023-01-24 DIAGNOSIS — I2510 Atherosclerotic heart disease of native coronary artery without angina pectoris: Secondary | ICD-10-CM | POA: Diagnosis present

## 2023-01-24 MED ORDER — ROSUVASTATIN CALCIUM 10 MG PO TABS: 10.0000 mg | ORAL_TABLET | Freq: Every day | ORAL | 3 refills | Status: DC

## 2023-01-24 NOTE — Patient Instructions (Addendum)
Monitor blood pressure  Goal is <140 on the top If elevated, call the office   Medication Instructions:  Crestor 10 mg daily  If you need a refill on your cardiac medications before your next appointment, please call your pharmacy.   Lab work: No new labs needed  Testing/Procedures: No new testing needed  Follow-Up: At [facility], you and your health needs are our priority.  As part of our continuing mission to provide you with exceptional heart care, we have created designated Provider Care Teams.  These Care Teams include your primary Cardiologist (physician) and Advanced Practice Providers (APPs -  Physician Assistants and Nurse Practitioners) who all work together to provide you with the care you need, when you need it.  You will need a follow up appointment as needed  Providers on your designated Care Team:   Nicolasa Ducking, NP Eula Listen, PA-C Cadence Fransico Michael, [place]  COVID-19 Vaccine Information can be found at: [email or web address] For questions related to vaccine distribution or appointments, please email [email or web address] or call [phone].

## 2023-01-25 ENCOUNTER — Telehealth: Payer: Self-pay | Admitting: Cardiovascular Disease

## 2023-01-25 NOTE — Telephone Encounter (Signed)
Left VM advising patient PCP was changed in the chart and change in synthroid noted.

## 2023-01-25 NOTE — Telephone Encounter (Signed)
  The patient is calling to provide corrections regarding her AVS. Her primary care provider is now Lance Bosch, NP, at [facility]. Additionally, she is currently taking 100 mcg of levothyroxine for her thyroid medication.

## 2023-06-03 ENCOUNTER — Emergency Department
Admission: EM | Admit: 2023-06-03 | Discharge: 2023-06-03 | Disposition: A | Discharge status: Home / Self Care | Payer: Medicare Other | Attending: Student in an Organized Health Care Education/Training Program | Admitting: Student in an Organized Health Care Education/Training Program

## 2023-06-03 ENCOUNTER — Emergency Department: Payer: Medicare Other

## 2023-06-03 ENCOUNTER — Other Ambulatory Visit: Payer: Self-pay

## 2023-06-03 DIAGNOSIS — E039 Hypothyroidism, unspecified: Secondary | ICD-10-CM | POA: Insufficient documentation

## 2023-06-03 DIAGNOSIS — M25522 Pain in left elbow: Secondary | ICD-10-CM | POA: Diagnosis present

## 2023-06-03 DIAGNOSIS — S52022A Displaced fracture of olecranon process without intraarticular extension of left ulna, initial encounter for closed fracture: Secondary | ICD-10-CM

## 2023-06-03 DIAGNOSIS — S52021B Displaced fracture of olecranon process without intraarticular extension of right ulna, initial encounter for open fracture type I or II: Secondary | ICD-10-CM | POA: Diagnosis not present

## 2023-06-03 DIAGNOSIS — W010XXA Fall on same level from slipping, tripping and stumbling without subsequent striking against object, initial encounter: Secondary | ICD-10-CM | POA: Diagnosis not present

## 2023-06-03 DIAGNOSIS — W19XXXA Unspecified fall, initial encounter: Secondary | ICD-10-CM

## 2023-06-03 DIAGNOSIS — Z87891 Personal history of nicotine dependence: Secondary | ICD-10-CM | POA: Insufficient documentation

## 2023-06-03 DIAGNOSIS — I10 Essential (primary) hypertension: Secondary | ICD-10-CM | POA: Insufficient documentation

## 2023-06-03 MED ORDER — ACETAMINOPHEN 325 MG PO TABS
650.0000 mg | ORAL_TABLET | Freq: Once | ORAL | Status: AC
  Administered 2023-06-03: 650 mg via ORAL
  Filled 2023-06-03: qty 2

## 2023-06-03 NOTE — Discharge Instructions (Signed)
Keep your splint clean and dry.  Please follow-up with your orthopedist, and if you are unable to, then you may follow-up with the orthopedist listed on your paperwork.  Please return for any new, worsening, or change in symptoms or other concerns.  It was a pleasure caring for you today.

## 2023-06-03 NOTE — ED Notes (Signed)
Patient declined discharge vital signs. 

## 2023-06-03 NOTE — ED Provider Notes (Signed)
[facility] Provider Note    Event Date/Time   First MD Initiated Contact with Patient 06/03/23 1412     (approximate)   History   Fall   HPI  Jocelyn Payne is a 72 y.o. female who presents today for evaluation of left elbow pain after a mechanical fall that occurred just prior to arrival.  Patient reports that she tripped on a folded rug and landed directly on her left elbow.  There is no head strike or LOC.  Does not take anticoagulation.  No other injury sustained.  She denies numbness or tingling.  No headache or neck pain.  Patient Active Problem List   Diagnosis Date Noted   Coronary artery calcification 01/23/2023   Medicare annual wellness visit, initial 06/14/2018   Alcohol intake above recommended sensible limits 05/05/2017   Polycythemia 05/04/2017   Positive colorectal cancer screening using DNA-based stool test 07/13/2016   Welcome to Medicare preventive visit 04/02/2016   History of shingles 03/26/2016   Routine general medical examination at a health care facility 06/08/2011   HLD (hyperlipidemia) 12/16/2010   Hypothyroidism 10/07/2010   Allergic rhinitis 08/12/2010   Essential hypertension 08/12/2010          Physical Exam   Triage Vital Signs: ED Triage Vitals  Encounter Vitals Group     BP 06/03/23 1333 (!) 160/99     Systolic BP Percentile --      Diastolic BP Percentile --      Pulse Rate 06/03/23 1333 100     Resp 06/03/23 1333 18     Temp 06/03/23 1333 98 F (36.7 C)     Temp Source 06/03/23 1333 Oral     SpO2 06/03/23 1333 98 %     Weight 06/03/23 1333 116 lb (52.6 kg)     Height 06/03/23 1333 5\' 1"  (1.549 m)     Head Circumference --      Peak Flow --      Pain Score 06/03/23 1332 7     Pain Loc --      Pain Education --      Exclude from Growth Chart --     Most recent vital signs: Vitals:   06/03/23 1333  BP: (!) 160/99  Pulse: 100  Resp: 18  Temp: 98 F (36.7 C)  SpO2: 98%    Physical  Exam Vitals and nursing note reviewed.  Constitutional:      General: Awake and alert. No acute distress.    Appearance: Normal appearance. The patient is normal weight.  HENT:     Head: Normocephalic and atraumatic.     Mouth: Mucous membranes are moist.  Eyes:     General: PERRL. Normal EOMs        Right eye: No discharge.        Left eye: No discharge.     Conjunctiva/sclera: Conjunctivae normal.  Cardiovascular:     Rate and Rhythm: Normal rate and regular rhythm.     Pulses: Normal pulses.  Pulmonary:     Effort: Pulmonary effort is normal. No respiratory distress.     Breath sounds: Normal breath sounds.  Abdominal:     Abdomen is soft. There is no abdominal tenderness. No rebound or guarding. No distention. Musculoskeletal:        General: No swelling. Normal range of motion.     Cervical back: Normal range of motion and neck supple. No midline cervical spine tenderness.  Full range of motion  of neck.  Negative Spurling test.  Negative Lhermitte sign.  Normal strength and sensation in bilateral upper extremities. Normal grip strength bilaterally.  Normal intrinsic muscle function of the hand bilaterally.  Normal radial pulses bilaterally. Left elbow: Swelling noted over the olecranon with superficial abrasion.  Sensation intact to light touch distally and proximally.  Normal radial pulse.  Normal intrinsic muscle function of the hand.  Compartment soft compressible throughout.  No pain at the shoulder or wrist.  Full and normal range of motion of shoulder and wrist. Skin:    General: Skin is warm and dry.     Capillary Refill: Capillary refill takes less than 2 seconds.     Findings: No rash.  Neurological:     Mental Status: The patient is awake and alert.      ED Results / Procedures / Treatments   Labs (all labs ordered are listed, but only abnormal results are displayed) Labs Reviewed - No data to display   EKG     RADIOLOGY I independently reviewed and  interpreted imaging and agree with radiologists findings.     PROCEDURES:  Critical Care performed:   Procedures   MEDICATIONS ORDERED IN ED: Medications  acetaminophen (TYLENOL) tablet 650 mg (650 mg Oral Given 06/03/23 1621)     IMPRESSION / MDM / ASSESSMENT AND PLAN / ED COURSE  I reviewed the triage vital signs and the nursing notes.   Differential diagnosis includes, but is not limited to, fracture, dislocation, bursitis, hematoma.  Patient is awake and alert, hemodynamically stable and afebrile.  She is neurovascularly intact.  Her compartments are soft and compressible throughout.  There is no head strike or LOC, she has no headache or neck pain.   X-ray per my independent interpretation reveals a olecranon fracture.  She has an overlying abrasion, though this is superficial.  I had discussed the case with Dr. Rosann Auerbach, though he was already in the hospital and actually came to see the patient in person.  He agrees with plan for posterior long-arm splint and recommends well-padded splint which was discussed with the tech and splint was applied by the ER tech.  She will likely require ORIF as an outpatient which was discussed with the patient by the orthopedic surgeon.  In the meantime we discussed splint care.  Patient has an orthopedic surgeon in [facility] whom she would like to follow-up with.  She was also given the information for [facility] in case she is unable to reach this surgeon.  Patient remained neurovascularly intact both before and after splint placement.  She requested Tylenol for pain control which was provided, she did not want anything stronger than this.  We discussed return precautions the importance of close outpatient follow-up.  Patient understands and agrees with plan.  She was discharged with her family member in stable condition.   Patient's presentation is most consistent with acute complicated illness / injury requiring diagnostic workup.   Clinical  Course as of 06/03/23 1741  Fri Jun 03, 2023  1441 Dr. Rosann Auerbach paged, left message with his secretary and also secure chat sent [JP]  1451 Dr. Rosann Auerbach evaluating patient at bedside [JP]    Clinical Course User Index [JP] Zyana Amaro, Herb Grays, PA-C     FINAL CLINICAL IMPRESSION(S) / ED DIAGNOSES   Final diagnoses:  Olecranon fracture, left, closed, initial encounter  Fall, initial encounter     Rx / DC Orders   ED Discharge Orders     None  Note:  This document was prepared using Dragon voice recognition software and may include unintentional dictation errors.   Keturah Shavers 06/03/23 1741    Willy Eddy, MD 06/03/23 [phone]

## 2023-06-03 NOTE — Consult Note (Signed)
ORTHOPEDIC CONSULTATION  Jocelyn Payne [id number]  06/03/2023  CC:  Chief Complaint  Patient presents with   Fall    History of Present IlIness: The patient is a 72 y.o. female who suffered a ground-level fall landing on her left elbow.  She was transported to the local emergency department.  I was called to consult on the case and reviewed the patient's left elbow radiographs to determine whether she could be discharged home.  The patient denies any loss of consciousness.  Further questioning of the patient reveals that she lives in [place], [place] and has an orthopedist from a previous injury where she broke her left wrist.  She denies neck or back pain.  PMH:  Past Medical History:  Diagnosis Date   Allergic rhinitis, cause unspecified    Conversion disorder    HLD (hyperlipidemia)    high LDL   HTN (hypertension)    Hypothyroid    OA (osteoarthritis)    left wrist from fracture     SH:  Past Surgical History:  Procedure Laterality Date   WRIST SURGERY  02/2008   right fracture    ALL: No Known Allergies  MED: (Not in a hospital admission)   All home medications have been reviewed as documented in the medication reconciliation portion of the patient record.  FH:  Family History  Problem Relation Age of Onset   Hyperlipidemia Mother    Hypertension Mother    Cancer Father        Lung and esophageal   Hypertension Sister    Hypothyroidism Sister    Hypertension Brother     Social:  reports that she quit smoking about 17 years ago. Her smoking use included cigarettes. She started smoking about 42 years ago. She has a 25 pack-year smoking history. She has never used smokeless tobacco. She reports current alcohol use. She reports that she does not use drugs.  Review of Systems: General: Denies fever, chills, weight loss Eyes: Denies blurry vision, changes in vision ENT: Denies sore throat, congestions, nosebleeds CV: Denies chest pain,  palpitations Respiratory: Denies shortness of breath, wheezing, cough Gl: Denies abdominal pain, nausea, vomiting GU: Denies hematuria Integumentary: Denies rashes or lesions Neuro: Denies headache, dizziness Psych: Negative Hem/Onc: Denies easy bruising or bleeding disorders Musculoskeletal: See HPI above.  Vitals: BP (!) 160/99 (BP Location: Left Arm)   Pulse 100   Temp 98 F (36.7 C) (Oral)   Resp 18   Ht 5\' 1"  (1.549 m)   Wt 52.6 kg   SpO2 98%   BMI 21.92 kg/m    Physical Exam: General: Awake, alert and oriented, no acute distress. Eyes: Pupils reactive, EOMI, normal conjunctiva, no scleral icterus. HENT: Normocephalic, atraumatic, normal hearing, moist oral mucosa Neck: Supple, non-tender, no cervical lymphadenopathy. Lungs: Chest rise is symmetric, non-labored respiration, chest wall nontender to palpation Heart: Normal rate by palpation, normal peripheral perfusion Abdomen: Soft, non-tender, non-distended. Pelvis is stable. Skin: Skin envelope intact, dry and pink, no rashes or lesions, no signs of infection. Neurologic: Awake, alert, and oriented X3 Psychiatric: Cooperative, appropriate mood and affect.  Musculoskeletal: Evaluation of the patient's left elbow reveals it to be moderately swollen with early contusion noted.  There is a very superficial abrasion that has been cleansed and covered with a Tegaderm.  Range of motion of the left elbow is greatly decreased from normal secondary to pain.  The patient's wrist has a decreased range of motion but the patient reports as her baseline  after previous wrist injury.  She has no tenderness to palpation around her left shoulder.  She is neurologically intact to the radial, median, and ulnar nerves distally.  She is able to move her fingers and thumb well on command.  Radiographic findings: Radiographs of the patient's left elbow reveal a transverse olecranon fracture with very minimal small comminution.  The radial head and  remainder of the elbow joint are well reduced.  No distal humeral fractures are noted.   Labs:  No results for input(s): "HGB" in the last 72 hours. No results for input(s): "WBC", "RBC", "HCT", "PLT" in the last 72 hours. No results for input(s): "NA", "K", "CL", "CO2", "BUN", "CREATININE", "GLUCOSE", "CALCIUM" in the last 72 hours. No results for input(s): "LABPT", "INR" in the last 72 hours.   Assessment/Plan:    Assessment: 72 year old female with closed displaced left olecranon process fracture.   Plan:  I was asked by the emergency department provider to review the films which I did in person in the emergency department as well as evaluating the patient.  I discussed with the patient that with a well-padded splint she certainly is stable for discharge and will follow-up with her orthopedist upon return home.  We discussed that there will be further discussions of open reduction/internal fixation once the superficial skin abrasion has healed enough to not complicate surgery with increased risk of infection.  The patient is quite comfortable and agreeable with this conservative treatment plan I discussed the case with the emergency department provider and they will have the patient splinted and discharged.  Will be available if needed, otherwise we will sign off.  Cecil Cranker M.D. 06/03/2023 3:14 PM

## 2023-06-03 NOTE — ED Triage Notes (Signed)
Pt sts that she fell and landed on her left elbow today.

## 2024-03-15 ENCOUNTER — Other Ambulatory Visit: Payer: Self-pay | Admitting: Cardiovascular Disease

## 2024-03-16 NOTE — Telephone Encounter (Signed)
Please contact pt for future appointment. Pt overdue for follow up.

## 2024-03-19 NOTE — Telephone Encounter (Signed)
 Left voicemail, pt is overdue for 12 month f/u

## 2024-04-13 ENCOUNTER — Other Ambulatory Visit: Payer: Self-pay | Admitting: Cardiovascular Disease

## 2024-05-19 ENCOUNTER — Other Ambulatory Visit: Payer: Self-pay | Admitting: Cardiovascular Disease

## 2024-06-01 ENCOUNTER — Other Ambulatory Visit: Payer: Self-pay | Admitting: Cardiovascular Disease

## 2024-06-01 NOTE — Telephone Encounter (Signed)
 Left voice mail

## 2024-06-01 NOTE — Telephone Encounter (Signed)
Please contact pt for future appointment. Pt overdue for follow up.

## 2024-06-07 ENCOUNTER — Telehealth: Payer: Self-pay | Admitting: Cardiovascular Disease

## 2024-06-07 NOTE — Telephone Encounter (Signed)
*  STAT* If patient is at the pharmacy, call can be transferred to refill team.   1. Which medications need to be refilled? (please list name of each medication and dose if known) rosuvastatin  (CRESTOR ) 10 MG tablet    2. Would you like to learn more about the convenience, safety, & potential cost savings by using the [facility]?     3. Are you open to using the [facility] (Type [facility]. ).   4. Which pharmacy/location (including street and city if local pharmacy) is medication to be sent to? [id number] - [place], [place] - [address]    5. Do they need a 30 day or 90 day supply? 90 day

## 2024-06-12 ENCOUNTER — Other Ambulatory Visit: Payer: Self-pay | Admitting: Cardiovascular Disease

## 2024-06-21 ENCOUNTER — Other Ambulatory Visit: Payer: Self-pay | Admitting: Cardiovascular Disease

## 2024-07-20 ENCOUNTER — Other Ambulatory Visit: Payer: Self-pay | Admitting: Cardiovascular Disease

## 2024-07-29 NOTE — Progress Notes (Unsigned)
 Cardiology Office Note  Date:  07/31/2024   ID:  [name], DOB [birth year], MRN [id number]  PCP:  Barbra Odor, NP   Chief Complaint  Patient presents with   12 month follow up     Patient denies chest pain or shortness of breath or any other cardiac issues.     HPI:  Ms. Jocelyn Payne is a 74 year old woman with past medical history of Smoking, former, quit >17 years ago Moderate emphysema on CT Essential hypertension Hypothyroidism Who presents for follow-up of her coronary calcification on CT  LOV 7/24 In follow-up today reports doing well In the process of moving Helps homeschool three 23-year-old grandchildren  Breathing ok, denies COPD exacerbation  Blood pressure elevated today, reports it is typically elevated in doctor offices Has not been checking it at home, does not know where her blood pressure cuff is located but reports it is typically better at home BP 135/80  Denies chest pain or shortness of breath concerning for angina  Lab work through primary care reviewed Total chol 176, LDL 70  Former smoking history but quit more than 17 years ago History of hyperlipidemia, tolerating Crestor  10 No significant diabetes history  CT scan March 2024 at [facility], unable to see images Mild coronary artery calcification detailed in the report Moderate emphysema  EKG personally reviewed by myself on todays visit EKG Interpretation Date/Time:  Tuesday July 31 2024 10:03:17 EST Ventricular Rate:  82 PR Interval:  174 QRS Duration:  88 QT Interval:  376 QTC Calculation: 439 R Axis:   4  Text Interpretation: Normal sinus rhythm Possible Left atrial enlargement When compared with ECG of 24-Jan-2023 15:38, Non-specific change in ST segment in Inferior leads Nonspecific T wave abnormality, improved in Lateral leads Confirmed by Perla Lye [id number]) on 07/31/2024 10:09:40 AM    PMH:   has a past medical history of Allergic rhinitis, cause unspecified, Conversion  disorder, HLD (hyperlipidemia), HTN (hypertension), Hypothyroid, and OA (osteoarthritis).  PSH:    Past Surgical History:  Procedure Laterality Date   WRIST SURGERY  02/2008   right fracture    Current Outpatient Medications  Medication Sig Dispense Refill   acetaminophen  (TYLENOL ) 500 MG tablet Take 325 mg by mouth every 4 (four) hours as needed.     amLODipine  (NORVASC ) 10 MG tablet Take 1 tablet (10 mg total) by mouth daily. 90 tablet 0   levothyroxine  (SYNTHROID ) 88 MCG tablet TAKE 1 TABLET (88 MCG TOTAL) BY MOUTH DAILY. IN AM BEFORE BREAKFAST 90 tablet 0   lisinopril -hydrochlorothiazide  (ZESTORETIC ) 20-25 MG tablet Take 0.5 tablets by mouth daily.     rosuvastatin  (CRESTOR ) 10 MG tablet Take 1 tablet (10 mg total) by mouth daily. 90 tablet 3   No current facility-administered medications for this visit.    Allergies:   Latex   Social History:  The patient  reports that she quit smoking about 18 years ago. Her smoking use included cigarettes. She started smoking about 43 years ago. She has a 25 pack-year smoking history. She has never used smokeless tobacco. She reports current alcohol use. She reports that she does not use drugs.   Family History:   family history includes Cancer in her father; Hyperlipidemia in her mother; Hypertension in her brother, mother, and sister; Hypothyroidism in her sister.    Review of Systems: Review of Systems  Constitutional: Negative.   HENT: Negative.    Respiratory: Negative.    Cardiovascular: Negative.   Gastrointestinal: Negative.   Musculoskeletal: Negative.  Neurological: Negative.   Psychiatric/Behavioral: Negative.    All other systems reviewed and are negative.   PHYSICAL EXAM: VS:  BP (!) 150/75 (BP Location: Left Arm, Patient Position: Sitting, Cuff Size: Normal)   Pulse 82   Ht 5' 1 (1.549 m)   Wt 121 lb 4 oz (55 kg)   SpO2 97%   BMI 22.91 kg/m  , BMI Body mass index is 22.91 kg/m. Constitutional:  oriented to  person, place, and time. No distress.  HENT:  Head: Normocephalic and atraumatic.  Eyes:  no discharge. No scleral icterus.  Neck: Normal range of motion. Neck supple. No JVD present.  Cardiovascular: Normal rate, regular rhythm, normal heart sounds and intact distal pulses. Exam reveals no gallop and no friction rub. No edema No murmur heard. Pulmonary/Chest: Effort normal and breath sounds normal. No stridor. No respiratory distress.  no wheezes.  no rales.  no tenderness.  Abdominal: Soft.  no distension.  no tenderness.  Musculoskeletal: Normal range of motion.  no  tenderness or deformity.  Neurological:  normal muscle tone. Coordination normal. No atrophy Skin: Skin is warm and dry. No rash noted. not diaphoretic.  Psychiatric:  normal mood and affect. behavior is normal. Thought content normal.   Recent Labs: No results found for requested labs within last 365 days.    Lipid Panel Lab Results  Component Value Date   CHOL 228 (H) 11/21/2020   HDL 79.90 11/21/2020   LDLCALC 131 (H) 11/21/2020   TRIG 90.0 11/21/2020      Wt Readings from Last 3 Encounters:  07/31/24 121 lb 4 oz (55 kg)  06/03/23 116 lb (52.6 kg)  01/24/23 120 lb 4 oz (54.5 kg)      ASSESSMENT AND PLAN:  Problem List Items Addressed This Visit       Cardiology Problems   Coronary artery calcification - Primary   Relevant Medications   lisinopril -hydrochlorothiazide  (ZESTORETIC ) 20-25 MG tablet   rosuvastatin  (CRESTOR ) 10 MG tablet   Other Relevant Orders   EKG 12-Lead (Completed)   Essential hypertension   Relevant Medications   lisinopril -hydrochlorothiazide  (ZESTORETIC ) 20-25 MG tablet   rosuvastatin  (CRESTOR ) 10 MG tablet   Other Relevant Orders   EKG 12-Lead (Completed)   HLD (hyperlipidemia)   Relevant Medications   lisinopril -hydrochlorothiazide  (ZESTORETIC ) 20-25 MG tablet   rosuvastatin  (CRESTOR ) 10 MG tablet     Other   Hypothyroidism    Coronary calcification Noted to be  mild on CT scan report March 2024 Prior history of smoking  tolerating Crestor  10 mg daily 60 point drop in cholesterol down to 170 LDL down to 70 Prefers no additional medication changes  Essential hypertension Blood pressure elevated, mild improvement on recheck Reports it typically runs high in doctors offices, better controlled at home but has not been able to find her blood pressure cuff at home as she is moving At her request no changes made Recommended if blood pressure runs high we increase lisinopril  up to 40 stay on HCTZ 12.5 stay on amlodipine  10  Hyperlipidemia Numbers improved on Crestor  10 She prefers no further changes  Hypothyroidism Managed by primary care   Signed, Velinda Lunger, M.D., Ph.D. [facility] [place], [place] [phone]

## 2024-07-31 ENCOUNTER — Encounter: Payer: Self-pay | Admitting: Cardiovascular Disease

## 2024-07-31 ENCOUNTER — Ambulatory Visit: Attending: Cardiovascular Disease | Admitting: Cardiovascular Disease

## 2024-07-31 VITALS — BP 150/75 | HR 82 | Ht 61.0 in | Wt 121.2 lb

## 2024-07-31 DIAGNOSIS — E039 Hypothyroidism, unspecified: Secondary | ICD-10-CM | POA: Insufficient documentation

## 2024-07-31 DIAGNOSIS — I2510 Atherosclerotic heart disease of native coronary artery without angina pectoris: Secondary | ICD-10-CM | POA: Diagnosis not present

## 2024-07-31 DIAGNOSIS — I10 Essential (primary) hypertension: Secondary | ICD-10-CM | POA: Diagnosis not present

## 2024-07-31 DIAGNOSIS — E7800 Pure hypercholesterolemia, unspecified: Secondary | ICD-10-CM | POA: Insufficient documentation

## 2024-07-31 MED ORDER — ROSUVASTATIN CALCIUM 10 MG PO TABS: 10.0000 mg | ORAL_TABLET | Freq: Every day | ORAL | 3 refills | Status: AC

## 2024-07-31 MED ORDER — LISINOPRIL-HYDROCHLOROTHIAZIDE 20-25 MG PO TABS: 0.5000 | ORAL_TABLET | Freq: Every day | ORAL | 3 refills | Status: AC

## 2024-07-31 MED ORDER — AMLODIPINE BESYLATE 10 MG PO TABS: 10.0000 mg | ORAL_TABLET | Freq: Every day | ORAL | 3 refills | Status: AC

## 2024-07-31 NOTE — Patient Instructions (Addendum)
